# Patient Record
Sex: Female | Born: 1945 | Race: White | Hispanic: No | Marital: Married | Smoking: Former smoker
Health system: Southern US, Community
[De-identification: ages and names within clinical notes are randomized; demographics above are authoritative.]

## PROBLEM LIST (undated history)

## (undated) DIAGNOSIS — D649 Anemia, unspecified: Secondary | ICD-10-CM

## (undated) DIAGNOSIS — E039 Hypothyroidism, unspecified: Secondary | ICD-10-CM

## (undated) DIAGNOSIS — F329 Major depressive disorder, single episode, unspecified: Secondary | ICD-10-CM

## (undated) DIAGNOSIS — G4700 Insomnia, unspecified: Secondary | ICD-10-CM

## (undated) DIAGNOSIS — C801 Malignant (primary) neoplasm, unspecified: Secondary | ICD-10-CM

## (undated) DIAGNOSIS — M869 Osteomyelitis, unspecified: Secondary | ICD-10-CM

## (undated) DIAGNOSIS — I890 Lymphedema, not elsewhere classified: Secondary | ICD-10-CM

## (undated) DIAGNOSIS — K219 Gastro-esophageal reflux disease without esophagitis: Secondary | ICD-10-CM

## (undated) DIAGNOSIS — I10 Essential (primary) hypertension: Secondary | ICD-10-CM

## (undated) DIAGNOSIS — I2510 Atherosclerotic heart disease of native coronary artery without angina pectoris: Secondary | ICD-10-CM

## (undated) DIAGNOSIS — G609 Hereditary and idiopathic neuropathy, unspecified: Secondary | ICD-10-CM

## (undated) DIAGNOSIS — J309 Allergic rhinitis, unspecified: Secondary | ICD-10-CM

## (undated) DIAGNOSIS — Z972 Presence of dental prosthetic device (complete) (partial): Secondary | ICD-10-CM

## (undated) DIAGNOSIS — F419 Anxiety disorder, unspecified: Secondary | ICD-10-CM

## (undated) DIAGNOSIS — F32A Depression, unspecified: Secondary | ICD-10-CM

## (undated) DIAGNOSIS — M1990 Unspecified osteoarthritis, unspecified site: Secondary | ICD-10-CM

## (undated) HISTORY — PX: THYROIDECTOMY: SHX17

## (undated) HISTORY — PX: CHOLECYSTECTOMY: SHX55

## (undated) HISTORY — PX: ABDOMINAL HYSTERECTOMY: SHX81

## (undated) HISTORY — PX: TONSILLECTOMY: SUR1361

## (undated) HISTORY — PX: KNEE ARTHROPLASTY: SHX992

---

## 2006-06-10 ENCOUNTER — Ambulatory Visit: Payer: Self-pay | Admitting: Family Medicine

## 2006-07-23 ENCOUNTER — Ambulatory Visit: Payer: Self-pay | Admitting: Family Medicine

## 2007-08-12 ENCOUNTER — Ambulatory Visit: Payer: Self-pay | Admitting: Family Medicine

## 2008-03-18 ENCOUNTER — Ambulatory Visit: Payer: Self-pay | Admitting: Family Medicine

## 2008-08-16 ENCOUNTER — Ambulatory Visit: Payer: Self-pay | Admitting: Family Medicine

## 2009-08-21 ENCOUNTER — Ambulatory Visit: Payer: Self-pay | Admitting: Family Medicine

## 2010-06-30 ENCOUNTER — Ambulatory Visit: Payer: Self-pay | Admitting: Internal Medicine

## 2010-08-23 ENCOUNTER — Ambulatory Visit: Payer: Self-pay | Admitting: Family Medicine

## 2011-03-14 ENCOUNTER — Emergency Department: Payer: Self-pay | Admitting: Emergency Medicine

## 2011-04-01 ENCOUNTER — Ambulatory Visit: Payer: Self-pay | Admitting: Internal Medicine

## 2011-12-04 ENCOUNTER — Ambulatory Visit: Payer: Self-pay | Admitting: Family Medicine

## 2011-12-05 ENCOUNTER — Ambulatory Visit: Payer: Self-pay | Admitting: Family Medicine

## 2012-11-06 ENCOUNTER — Emergency Department: Payer: Self-pay | Admitting: Emergency Medicine

## 2012-11-06 LAB — COMPREHENSIVE METABOLIC PANEL
Alkaline Phosphatase: 90 U/L (ref 50–136)
Anion Gap: 6 — ABNORMAL LOW (ref 7–16)
Bilirubin,Total: 0.8 mg/dL (ref 0.2–1.0)
Calcium, Total: 9.1 mg/dL (ref 8.5–10.1)
Chloride: 105 mmol/L (ref 98–107)
Co2: 28 mmol/L (ref 21–32)
EGFR (African American): >60
EGFR (Non-African Amer.): >60
Osmolality: 278 (ref 275–301)
Potassium: 3.8 mmol/L (ref 3.5–5.1)
SGOT(AST): 39 U/L — ABNORMAL HIGH (ref 15–37)
Sodium: 139 mmol/L (ref 136–145)

## 2012-11-06 LAB — CBC WITH DIFFERENTIAL/PLATELET
Basophil #: 0.1 10*3/uL (ref 0.0–0.1)
Basophil %: 0.6 %
HCT: 46.5 % (ref 35.0–47.0)
HGB: 16.5 g/dL — ABNORMAL HIGH (ref 12.0–16.0)
Lymphocyte #: 1.8 10*3/uL (ref 1.0–3.6)
MCV: 104 fL — ABNORMAL HIGH (ref 80–100)
Monocyte %: 4.2 %
Neutrophil #: 5.8 10*3/uL (ref 1.4–6.5)
Neutrophil %: 71.4 %
RBC: 4.47 10*6/uL (ref 3.80–5.20)
WBC: 8.2 10*3/uL (ref 3.6–11.0)

## 2012-11-06 LAB — URINALYSIS, COMPLETE
Bacteria: NONE SEEN
Blood: NEGATIVE
Glucose,UR: NEGATIVE mg/dL (ref 0–75)
Nitrite: NEGATIVE
Ph: 6 (ref 4.5–8.0)
Protein: NEGATIVE
RBC,UR: NONE SEEN /HPF (ref 0–5)
Specific Gravity: 1.005 (ref 1.003–1.030)
Squamous Epithelial: NONE SEEN

## 2012-11-06 LAB — TROPONIN I: Troponin-I: <0.02 ng/mL

## 2013-04-07 ENCOUNTER — Ambulatory Visit: Payer: Self-pay | Admitting: Family Medicine

## 2013-12-09 HISTORY — PX: CARDIAC CATHETERIZATION: SHX172

## 2014-04-26 ENCOUNTER — Ambulatory Visit: Payer: Self-pay | Admitting: Family Medicine

## 2014-05-05 ENCOUNTER — Ambulatory Visit: Payer: Self-pay | Admitting: Family Medicine

## 2014-05-09 ENCOUNTER — Ambulatory Visit: Payer: Self-pay | Admitting: Family Medicine

## 2014-05-09 HISTORY — PX: BREAST BIOPSY: SHX20

## 2014-05-12 LAB — PATHOLOGY REPORT

## 2014-08-30 ENCOUNTER — Ambulatory Visit: Payer: Self-pay | Admitting: Emergency Medicine

## 2014-11-09 ENCOUNTER — Ambulatory Visit: Payer: Self-pay | Admitting: Surgery

## 2016-01-31 ENCOUNTER — Other Ambulatory Visit: Payer: Self-pay | Admitting: Hematology and Oncology

## 2016-01-31 DIAGNOSIS — Z1231 Encounter for screening mammogram for malignant neoplasm of breast: Secondary | ICD-10-CM

## 2016-02-29 ENCOUNTER — Ambulatory Visit: Payer: Self-pay

## 2016-03-09 ENCOUNTER — Emergency Department
Admission: EM | Admit: 2016-03-09 | Discharge: 2016-03-09 | Disposition: A | Discharge status: Home / Self Care | Payer: Medicare Other | Attending: Emergency Medicine | Admitting: Emergency Medicine

## 2016-03-09 ENCOUNTER — Emergency Department: Payer: Medicare Other

## 2016-03-09 DIAGNOSIS — L03031 Cellulitis of right toe: Secondary | ICD-10-CM | POA: Diagnosis present

## 2016-03-09 DIAGNOSIS — I10 Essential (primary) hypertension: Secondary | ICD-10-CM | POA: Diagnosis not present

## 2016-03-09 DIAGNOSIS — Z880 Allergy status to penicillin: Secondary | ICD-10-CM | POA: Diagnosis not present

## 2016-03-09 HISTORY — DX: Atherosclerotic heart disease of native coronary artery without angina pectoris: I25.10

## 2016-03-09 HISTORY — DX: Essential (primary) hypertension: I10

## 2016-03-09 HISTORY — DX: Lymphedema, not elsewhere classified: I89.0

## 2016-03-09 HISTORY — DX: Malignant (primary) neoplasm, unspecified: C80.1

## 2016-03-09 MED ORDER — CIPROFLOXACIN HCL 500 MG PO TABS: 500.0000 mg | ORAL_TABLET | Freq: Two times a day (BID) | ORAL | Status: DC

## 2016-03-09 MED ORDER — CEPHALEXIN 500 MG PO CAPS: 500.0000 mg | ORAL_CAPSULE | Freq: Two times a day (BID) | ORAL | Status: DC

## 2016-03-09 NOTE — ED Notes (Signed)
Pt c/o left leg swelling and redness in RLE since Wedensday but worsening. Hx lymphedema and cellulitis. Reports have sore at end of foot, currently wrapped in gauze.

## 2016-03-09 NOTE — ED Provider Notes (Signed)
[facility] Emergency Department Provider Note  ____________________________________________    I have reviewed the triage vital signs and the nursing notes.   HISTORY  Chief Complaint Cellulitis    HPI Wanda Cooper is a 70 y.o. female who presents with complaints of redness on her third toe on her right foot. Patient reports she does not have sensation in her feet, she does not have a history of diabetes. She reports she was trying to cut her toenails last week and actually cut her toe. She has developed some surrounding redness primarily on the dorsal aspect of the third toe. She complains of pain that does travel into her hip from her right foot she does not believe this is related to her toe. She does have a history of cellulitis of the leg but no evidence of skin changes besides on her toe.     Past Medical History  Diagnosis Date  . Lymphedema   . Hypertension   . Coronary artery disease   . Cancer ([place])     There are no active problems to display for this patient.   Past Surgical History  Procedure Laterality Date  . Abdominal hysterectomy    . Knee arthroplasty    . Cholecystectomy      No current outpatient prescriptions on file.  Allergies Amoxicillin; Augmentin; Macrobid; Penicillins; and Sulfur  History reviewed. No pertinent family history.  Social History Social History  Substance Use Topics  . Smoking status: Never Smoker   . Smokeless tobacco: None  . Alcohol Use: No    Review of Systems  Constitutional: Negative for fever.   Musculoskeletal: No calf swelling Skin: As above  Psychiatric: no anxiety    ____________________________________________   PHYSICAL EXAM:  VITAL SIGNS: ED Triage Vitals  Enc Vitals Group     BP 03/09/16 1424 123/65 mmHg     Pulse Rate 03/09/16 1424 76     Resp 03/09/16 1424 16     Temp 03/09/16 1424 97.8 F (36.6 C)     Temp Source 03/09/16 1424 Oral     SpO2 03/09/16 1424  99 %     Weight 03/09/16 1424 170 lb (77.111 kg)     Height 03/09/16 1424 5\' 4"  (1.626 m)     Head Cir --      Peak Flow --      Pain Score 03/09/16 1426 4     Pain Loc --      Pain Edu? --      Excl. in [place]? --    Constitutional: Alert and oriented. Well appearing and in no distress.  Eyes: Conjunctivae are normal. No erythema or injection ENT   Head: Normocephalic and atraumatic.   Mouth/Throat: Mucous membranes are moist. Cardiovascular: Normal rate, regular rhythm. Normal and symmetric distal pulses are present in the lower extremities Respiratory: Normal respiratory effort without tachypnea nor retractions. Breath sounds are clear and equal bilaterally.  Gastrointestinal: Soft and non-tender in all quadrants. No distention. There is no CVA tenderness. Genitourinary: deferred Musculoskeletal: Nontender with normal range of motion in all extremities. No lower extremity tenderness nor edema. Neurologic:  Normal speech and language. No gross focal neurologic deficits are appreciated. Skin:  Skin is warm, dry and intact. Right third toe: Dried blood on the plantar aspect of the distal toe apparently where the patient tried to cut her toenail. There is some mild erythema extending on the dorsal aspect of the toe consistent with early cellulitis/infection. No evidence of  deep ulceration or osteomyelitis. Toe appears well perfused, the rest of the foot is unremarkable. Psychiatric: Mood and affect are normal. Patient exhibits appropriate insight and judgment.  ____________________________________________    LABS (pertinent positives/negatives)  Labs Reviewed - No data to display  ____________________________________________   EKG  None  ____________________________________________    RADIOLOGY  X-ray concerning for osteomyelitis of the tuft of the toe  ____________________________________________   PROCEDURES  Procedure(s) performed: none  Critical Care  performed: none  ____________________________________________   INITIAL IMPRESSION / ASSESSMENT AND PLAN / ED COURSE  Pertinent labs & imaging results that were available during my care of the patient were reviewed by me and considered in my medical decision making (see chart for details).  Suspect early cellulitis. Patient does see Dr. Elvina Mattes of podiatry.   ----------------------------------------- 5:25 PM on 03/09/2016 -----------------------------------------  Discussed x-ray with Dr. Vickki Muff of podiatry. He recommends antibiotics and outpatient follow-up for further management.   I will start antibiotics and have her follow-up with podiatry this week. Patient is well-appearing with normal vitals. She agrees with plan. Return precautions discussed  ____________________________________________   FINAL CLINICAL IMPRESSION(S) / ED DIAGNOSES  Final diagnoses:  Cellulitis of toe of right foot          Lavonia Drafts, MD 03/09/16 1726

## 2016-03-09 NOTE — ED Notes (Signed)
Patient states that about a week ago she cut the third toe on her right foot. Patient has had a sore in that area since then that is not healing. Patient c/o pain in right foot that extends to the hip. Patient has bilateral LE edema but states that this is base line for her.

## 2016-03-09 NOTE — Discharge Instructions (Signed)

## 2016-03-13 ENCOUNTER — Ambulatory Visit
Admission: RE | Admit: 2016-03-13 | Discharge: 2016-03-13 | Disposition: A | Discharge status: Home / Self Care | Payer: Medicare Other | Source: Ambulatory Visit | Attending: Hematology and Oncology | Admitting: Hematology and Oncology

## 2016-03-13 DIAGNOSIS — Z96651 Presence of right artificial knee joint: Secondary | ICD-10-CM | POA: Diagnosis not present

## 2016-03-13 DIAGNOSIS — Z90710 Acquired absence of both cervix and uterus: Secondary | ICD-10-CM | POA: Diagnosis not present

## 2016-03-13 DIAGNOSIS — E039 Hypothyroidism, unspecified: Secondary | ICD-10-CM | POA: Diagnosis not present

## 2016-03-13 DIAGNOSIS — G4700 Insomnia, unspecified: Secondary | ICD-10-CM | POA: Diagnosis not present

## 2016-03-13 DIAGNOSIS — M86671 Other chronic osteomyelitis, right ankle and foot: Secondary | ICD-10-CM | POA: Diagnosis present

## 2016-03-13 DIAGNOSIS — M1990 Unspecified osteoarthritis, unspecified site: Secondary | ICD-10-CM | POA: Diagnosis not present

## 2016-03-13 DIAGNOSIS — Z7982 Long term (current) use of aspirin: Secondary | ICD-10-CM | POA: Diagnosis not present

## 2016-03-13 DIAGNOSIS — K219 Gastro-esophageal reflux disease without esophagitis: Secondary | ICD-10-CM | POA: Diagnosis not present

## 2016-03-13 DIAGNOSIS — Z881 Allergy status to other antibiotic agents status: Secondary | ICD-10-CM | POA: Diagnosis not present

## 2016-03-13 DIAGNOSIS — Z79899 Other long term (current) drug therapy: Secondary | ICD-10-CM | POA: Diagnosis not present

## 2016-03-13 DIAGNOSIS — Z9889 Other specified postprocedural states: Secondary | ICD-10-CM | POA: Diagnosis not present

## 2016-03-13 DIAGNOSIS — L97519 Non-pressure chronic ulcer of other part of right foot with unspecified severity: Secondary | ICD-10-CM | POA: Diagnosis not present

## 2016-03-13 DIAGNOSIS — F329 Major depressive disorder, single episode, unspecified: Secondary | ICD-10-CM | POA: Diagnosis not present

## 2016-03-13 DIAGNOSIS — Z7951 Long term (current) use of inhaled steroids: Secondary | ICD-10-CM | POA: Diagnosis not present

## 2016-03-13 DIAGNOSIS — Z1231 Encounter for screening mammogram for malignant neoplasm of breast: Secondary | ICD-10-CM | POA: Diagnosis not present

## 2016-03-13 DIAGNOSIS — Z87891 Personal history of nicotine dependence: Secondary | ICD-10-CM | POA: Diagnosis not present

## 2016-03-13 DIAGNOSIS — Z9049 Acquired absence of other specified parts of digestive tract: Secondary | ICD-10-CM | POA: Diagnosis not present

## 2016-03-13 DIAGNOSIS — G609 Hereditary and idiopathic neuropathy, unspecified: Secondary | ICD-10-CM | POA: Diagnosis not present

## 2016-03-13 DIAGNOSIS — Z8542 Personal history of malignant neoplasm of other parts of uterus: Secondary | ICD-10-CM | POA: Diagnosis not present

## 2016-03-13 DIAGNOSIS — Z883 Allergy status to other anti-infective agents status: Secondary | ICD-10-CM | POA: Diagnosis not present

## 2016-03-13 DIAGNOSIS — F419 Anxiety disorder, unspecified: Secondary | ICD-10-CM | POA: Diagnosis not present

## 2016-03-13 DIAGNOSIS — I10 Essential (primary) hypertension: Secondary | ICD-10-CM | POA: Diagnosis not present

## 2016-03-13 DIAGNOSIS — Z882 Allergy status to sulfonamides status: Secondary | ICD-10-CM | POA: Diagnosis not present

## 2016-03-13 DIAGNOSIS — E7800 Pure hypercholesterolemia, unspecified: Secondary | ICD-10-CM | POA: Diagnosis not present

## 2016-03-13 DIAGNOSIS — Z7902 Long term (current) use of antithrombotics/antiplatelets: Secondary | ICD-10-CM | POA: Diagnosis not present

## 2016-03-13 DIAGNOSIS — Z880 Allergy status to penicillin: Secondary | ICD-10-CM | POA: Diagnosis not present

## 2016-03-13 NOTE — Discharge Instructions (Signed)
[facility] °[facility] ° °POST OPERATIVE INSTRUCTIONS FOR DR. TROXLER AND DR. FOWLER °[facility] ° ° °1. Take your medication as prescribed.  Pain medication should be taken only as needed. ° °2. Keep the dressing clean, dry and intact. ° °3. Keep your foot elevated above the heart level for the first 48 hours. ° °4. Walking to the bathroom and brief periods of walking are acceptable, unless we have instructed you to be non-weight bearing. ° °5. Always wear your post-op shoe when walking.  Always use your crutches if you are to be non-weight bearing. ° °6. Do not take a shower. Baths are permissible as long as the foot is kept out of the water.  ° °7. Every hour you are awake:  °- Bend your knee 15 times. °- Flex foot 15 times °- Massage calf 15 times ° °8. Call [facility] ([phone]) if any of the following problems occur: °- You develop a temperature or fever. °- The bandage becomes saturated with blood. °- Medication does not stop your pain. °- Injury of the foot occurs. °- Any symptoms of infection including redness, odor, or red streaks running from wound. ° °General Anesthesia, Adult, Care After °Refer to this sheet in the next few weeks. These instructions provide you with information on caring for yourself after your procedure. Your health care provider may also give you more specific instructions. Your treatment has been planned according to current medical practices, but problems sometimes occur. Call your health care provider if you have any problems or questions after your procedure. °WHAT TO EXPECT AFTER THE PROCEDURE °After the procedure, it is typical to experience: °· Sleepiness. °· Nausea and vomiting. °HOME CARE INSTRUCTIONS °· For the first 24 hours after general anesthesia: °¨ Have a responsible person with you. °¨ Do not drive a car. If you are alone, do not take public transportation. °¨ Do not drink alcohol. °¨ Do not take  medicine that has not been prescribed by your health care provider. °¨ Do not sign important papers or make important decisions. °¨ You may resume a normal diet and activities as directed by your health care provider. °· Change bandages (dressings) as directed. °· If you have questions or problems that seem related to general anesthesia, call the hospital and ask for the anesthetist or anesthesiologist on call. °SEEK MEDICAL CARE IF: °· You have nausea and vomiting that continue the day after anesthesia. °· You develop a rash. °SEEK IMMEDIATE MEDICAL CARE IF:  °· You have difficulty breathing. °· You have chest pain. °· You have any allergic problems. °  °This information is not intended to replace advice given to you by your health care provider. Make sure you discuss any questions you have with your health care provider. °  °Document Released: 03/03/2001 Document Revised: 12/16/2014 Document Reviewed: 03/25/2012 °Elsevier Interactive Patient Education ©2016 [employer] ° °

## 2016-03-14 ENCOUNTER — Ambulatory Visit: Payer: Medicare Other | Admitting: Anesthesiology

## 2016-03-14 ENCOUNTER — Encounter
Admission: RE | Disposition: A | Discharge status: Home / Self Care | Payer: Self-pay | Source: Ambulatory Visit | Attending: Podiatry

## 2016-03-14 ENCOUNTER — Ambulatory Visit
Admission: RE | Admit: 2016-03-14 | Discharge: 2016-03-14 | Disposition: A | Discharge status: Home / Self Care | Payer: Medicare Other | Source: Ambulatory Visit | Attending: Podiatry | Admitting: Podiatry

## 2016-03-14 DIAGNOSIS — Z883 Allergy status to other anti-infective agents status: Secondary | ICD-10-CM | POA: Insufficient documentation

## 2016-03-14 DIAGNOSIS — Z9889 Other specified postprocedural states: Secondary | ICD-10-CM | POA: Insufficient documentation

## 2016-03-14 DIAGNOSIS — L97519 Non-pressure chronic ulcer of other part of right foot with unspecified severity: Secondary | ICD-10-CM | POA: Insufficient documentation

## 2016-03-14 DIAGNOSIS — Z7982 Long term (current) use of aspirin: Secondary | ICD-10-CM | POA: Insufficient documentation

## 2016-03-14 DIAGNOSIS — Z8542 Personal history of malignant neoplasm of other parts of uterus: Secondary | ICD-10-CM | POA: Insufficient documentation

## 2016-03-14 DIAGNOSIS — Z1231 Encounter for screening mammogram for malignant neoplasm of breast: Secondary | ICD-10-CM | POA: Insufficient documentation

## 2016-03-14 DIAGNOSIS — Z96651 Presence of right artificial knee joint: Secondary | ICD-10-CM | POA: Insufficient documentation

## 2016-03-14 DIAGNOSIS — M86671 Other chronic osteomyelitis, right ankle and foot: Secondary | ICD-10-CM | POA: Diagnosis not present

## 2016-03-14 DIAGNOSIS — I10 Essential (primary) hypertension: Secondary | ICD-10-CM | POA: Insufficient documentation

## 2016-03-14 DIAGNOSIS — M1990 Unspecified osteoarthritis, unspecified site: Secondary | ICD-10-CM | POA: Insufficient documentation

## 2016-03-14 DIAGNOSIS — Z880 Allergy status to penicillin: Secondary | ICD-10-CM | POA: Insufficient documentation

## 2016-03-14 DIAGNOSIS — F329 Major depressive disorder, single episode, unspecified: Secondary | ICD-10-CM | POA: Insufficient documentation

## 2016-03-14 DIAGNOSIS — E039 Hypothyroidism, unspecified: Secondary | ICD-10-CM | POA: Insufficient documentation

## 2016-03-14 DIAGNOSIS — E7800 Pure hypercholesterolemia, unspecified: Secondary | ICD-10-CM | POA: Insufficient documentation

## 2016-03-14 DIAGNOSIS — Z7951 Long term (current) use of inhaled steroids: Secondary | ICD-10-CM | POA: Insufficient documentation

## 2016-03-14 DIAGNOSIS — Z7902 Long term (current) use of antithrombotics/antiplatelets: Secondary | ICD-10-CM | POA: Insufficient documentation

## 2016-03-14 DIAGNOSIS — Z90710 Acquired absence of both cervix and uterus: Secondary | ICD-10-CM | POA: Insufficient documentation

## 2016-03-14 DIAGNOSIS — Z79899 Other long term (current) drug therapy: Secondary | ICD-10-CM | POA: Insufficient documentation

## 2016-03-14 DIAGNOSIS — G4700 Insomnia, unspecified: Secondary | ICD-10-CM | POA: Insufficient documentation

## 2016-03-14 DIAGNOSIS — Z87891 Personal history of nicotine dependence: Secondary | ICD-10-CM | POA: Insufficient documentation

## 2016-03-14 DIAGNOSIS — F419 Anxiety disorder, unspecified: Secondary | ICD-10-CM | POA: Insufficient documentation

## 2016-03-14 DIAGNOSIS — Z9049 Acquired absence of other specified parts of digestive tract: Secondary | ICD-10-CM | POA: Insufficient documentation

## 2016-03-14 DIAGNOSIS — Z882 Allergy status to sulfonamides status: Secondary | ICD-10-CM | POA: Insufficient documentation

## 2016-03-14 DIAGNOSIS — K219 Gastro-esophageal reflux disease without esophagitis: Secondary | ICD-10-CM | POA: Insufficient documentation

## 2016-03-14 DIAGNOSIS — G609 Hereditary and idiopathic neuropathy, unspecified: Secondary | ICD-10-CM | POA: Insufficient documentation

## 2016-03-14 DIAGNOSIS — Z881 Allergy status to other antibiotic agents status: Secondary | ICD-10-CM | POA: Insufficient documentation

## 2016-03-14 HISTORY — DX: Gastro-esophageal reflux disease without esophagitis: K21.9

## 2016-03-14 HISTORY — DX: Allergic rhinitis, unspecified: J30.9

## 2016-03-14 HISTORY — DX: Presence of dental prosthetic device (complete) (partial): Z97.2

## 2016-03-14 HISTORY — DX: Insomnia, unspecified: G47.00

## 2016-03-14 HISTORY — DX: Osteomyelitis, unspecified: M86.9

## 2016-03-14 HISTORY — DX: Hypothyroidism, unspecified: E03.9

## 2016-03-14 HISTORY — DX: Unspecified osteoarthritis, unspecified site: M19.90

## 2016-03-14 HISTORY — DX: Depression, unspecified: F32.A

## 2016-03-14 HISTORY — DX: Hereditary and idiopathic neuropathy, unspecified: G60.9

## 2016-03-14 HISTORY — PX: METATARSAL OSTEOTOMY: SHX1641

## 2016-03-14 HISTORY — DX: Anxiety disorder, unspecified: F41.9

## 2016-03-14 HISTORY — DX: Major depressive disorder, single episode, unspecified: F32.9

## 2016-03-14 SURGERY — OSTEOTOMY, METATARSAL BONE
Anesthesia: Monitor Anesthesia Care | Laterality: Right

## 2016-03-14 MED ORDER — BUPIVACAINE HCL 0.25 % IJ SOLN
INTRAMUSCULAR | Status: DC | PRN
  Administered 2016-03-14: 4 mL

## 2016-03-14 MED ORDER — ONDANSETRON HCL 4 MG/2ML IJ SOLN
INTRAMUSCULAR | Status: DC | PRN
  Administered 2016-03-14: 4 mg via INTRAVENOUS

## 2016-03-14 MED ORDER — LACTATED RINGERS IV SOLN
INTRAVENOUS | Status: DC
  Administered 2016-03-14: 09:00:00 via INTRAVENOUS

## 2016-03-14 MED ORDER — OXYCODONE HCL 5 MG/5ML PO SOLN: 5.0000 mg | Freq: Once | ORAL | Status: DC | PRN

## 2016-03-14 MED ORDER — PROPOFOL 500 MG/50ML IV EMUL
INTRAVENOUS | Status: DC | PRN
Start: 2016-03-14 — End: 2016-03-14
  Administered 2016-03-14: 140 ug/kg/min via INTRAVENOUS

## 2016-03-14 MED ORDER — GLYCOPYRROLATE 0.2 MG/ML IJ SOLN
INTRAMUSCULAR | Status: DC | PRN
  Administered 2016-03-14: 0.2 mg via INTRAVENOUS

## 2016-03-14 MED ORDER — MIDAZOLAM HCL 2 MG/2ML IJ SOLN
INTRAMUSCULAR | Status: DC | PRN
  Administered 2016-03-14: 2 mg via INTRAVENOUS

## 2016-03-14 MED ORDER — OXYCODONE HCL 5 MG PO TABS: 5.0000 mg | ORAL_TABLET | Freq: Once | ORAL | Status: DC | PRN

## 2016-03-14 MED ORDER — PROMETHAZINE HCL 25 MG/ML IJ SOLN: 6.2500 mg | INTRAMUSCULAR | Status: DC | PRN

## 2016-03-14 MED ORDER — DEXTROSE 5 % IV SOLN
600.0000 mg | Freq: Once | INTRAVENOUS | Status: AC
  Administered 2016-03-14: 600 mg via INTRAVENOUS

## 2016-03-14 MED ORDER — DEXAMETHASONE SODIUM PHOSPHATE 4 MG/ML IJ SOLN
INTRAMUSCULAR | Status: DC | PRN
  Administered 2016-03-14: 4 mg via INTRAVENOUS

## 2016-03-14 MED ORDER — FENTANYL CITRATE (PF) 100 MCG/2ML IJ SOLN
INTRAMUSCULAR | Status: DC | PRN
  Administered 2016-03-14: 25 ug via INTRAVENOUS

## 2016-03-14 MED ORDER — LIDOCAINE HCL (CARDIAC) 20 MG/ML IV SOLN
INTRAVENOUS | Status: DC | PRN
  Administered 2016-03-14: 30 mg via INTRAVENOUS

## 2016-03-14 MED ORDER — HYDROMORPHONE HCL 1 MG/ML IJ SOLN: 0.2500 mg | INTRAMUSCULAR | Status: DC | PRN

## 2016-03-14 SURGICAL SUPPLY — 64 items
BANDAGE ELASTIC 4 CLIP NS LF (GAUZE/BANDAGES/DRESSINGS) ×3 IMPLANT
BENZOIN TINCTURE PRP APPL 2/3 (GAUZE/BANDAGES/DRESSINGS) ×3 IMPLANT
BLADE CRESCENTIC (BLADE) IMPLANT
BLADE MED AGGRESSIVE (BLADE) IMPLANT
BLADE MINI RND TIP GREEN BEAV (BLADE) IMPLANT
BLADE OSC/SAGITTAL 5.5X25 (BLADE) IMPLANT
BLADE OSC/SAGITTAL MD 5.5X18 (BLADE) IMPLANT
BLADE OSC/SAGITTAL MD 9X18.5 (BLADE) IMPLANT
BLADE OSCILLATING/SAGITTAL (BLADE)
BLADE SW THK.38XMED LNG THN (BLADE) IMPLANT
BNDG ESMARK 4X12 TAN STRL LF (GAUZE/BANDAGES/DRESSINGS) ×3 IMPLANT
BNDG GAUZE 4.5X4.1 6PLY STRL (MISCELLANEOUS) ×3 IMPLANT
BNDG STRETCH 4X75 STRL LF (GAUZE/BANDAGES/DRESSINGS) ×3 IMPLANT
BUR EGG 4X8 MED (BURR) IMPLANT
BUR STRYKR EGG 5.0 (BURR) IMPLANT
CANISTER SUCT 1200ML W/VALVE (MISCELLANEOUS) ×3 IMPLANT
CAST PADDING 3X4FT ST 30246 (SOFTGOODS)
CLOSURE WOUND 1/4X4 (GAUZE/BANDAGES/DRESSINGS) ×1
COVER LIGHT HANDLE UNIVERSAL (MISCELLANEOUS) ×6 IMPLANT
COVER PIN YLW 0.028-062 (MISCELLANEOUS) IMPLANT
CUFF TOURN SGL QUICK 18 (TOURNIQUET CUFF) IMPLANT
DRAPE FLUOR MINI C-ARM 54X84 (DRAPES) ×3 IMPLANT
DRILL WIRE PASS (DRILL) IMPLANT
DURAPREP 26ML APPLICATOR (WOUND CARE) ×3 IMPLANT
GAUZE PETRO XEROFOAM 1X8 (MISCELLANEOUS) ×3 IMPLANT
GAUZE SPONGE 4X4 12PLY STRL (GAUZE/BANDAGES/DRESSINGS) ×3 IMPLANT
GLOVE BIO SURGEON STRL SZ8 (GLOVE) ×3 IMPLANT
GOWN STRL REUS W/ TWL LRG LVL3 (GOWN DISPOSABLE) ×1 IMPLANT
GOWN STRL REUS W/ TWL XL LVL3 (GOWN DISPOSABLE) ×1 IMPLANT
GOWN STRL REUS W/TWL LRG LVL3 (GOWN DISPOSABLE) ×2
GOWN STRL REUS W/TWL XL LVL3 (GOWN DISPOSABLE) ×2
HEMOSTAT SURGICEL 2X3 (HEMOSTASIS) ×3 IMPLANT
K-WIRE DBL END TROCAR 6X.045 (WIRE)
K-WIRE DBL END TROCAR 6X.062 (WIRE)
KIT ROOM TURNOVER OR (KITS) ×3 IMPLANT
KWIRE DBL END TROCAR 6X.045 (WIRE) IMPLANT
KWIRE DBL END TROCAR 6X.062 (WIRE) IMPLANT
NEEDLE HYPO 18GX1.5 BLUNT FILL (NEEDLE) IMPLANT
NEEDLE HYPO 25GX1X1/2 BEV (NEEDLE) IMPLANT
NS IRRIG 500ML POUR BTL (IV SOLUTION) ×3 IMPLANT
PACK EXTREMITY ARMC (MISCELLANEOUS) ×3 IMPLANT
PAD CAST CTTN 3X4 STRL (SOFTGOODS) IMPLANT
PAD GROUND ADULT SPLIT (MISCELLANEOUS) ×3 IMPLANT
RASP SM TEAR CROSS CUT (RASP) ×3 IMPLANT
SPLINT CAST 1 STEP 4X30 (MISCELLANEOUS) ×3 IMPLANT
SPLINT FAST PLASTER 5X30 (CAST SUPPLIES)
SPLINT PLASTER CAST FAST 5X30 (CAST SUPPLIES) IMPLANT
STOCKINETTE STRL 6IN 960660 (GAUZE/BANDAGES/DRESSINGS) ×3 IMPLANT
STRAP BODY AND KNEE 60X3 (MISCELLANEOUS) ×3 IMPLANT
STRIP CLOSURE SKIN 1/4X4 (GAUZE/BANDAGES/DRESSINGS) ×2 IMPLANT
SUT ETHILON 4-0 (SUTURE) ×2
SUT ETHILON 4-0 FS2 18XMFL BLK (SUTURE) ×1
SUT ETHILON 5-0 FS-2 18 BLK (SUTURE) IMPLANT
SUT VIC AB 1 CT1 36 (SUTURE) IMPLANT
SUT VIC AB 2-0 CT1 27 (SUTURE)
SUT VIC AB 2-0 CT1 TAPERPNT 27 (SUTURE) IMPLANT
SUT VIC AB 2-0 SH 27 (SUTURE)
SUT VIC AB 2-0 SH 27XBRD (SUTURE) IMPLANT
SUT VIC AB 3-0 SH 27 (SUTURE)
SUT VIC AB 3-0 SH 27X BRD (SUTURE) IMPLANT
SUT VIC AB 4-0 FS2 27 (SUTURE) IMPLANT
SUT VICRYL AB 3-0 FS1 BRD 27IN (SUTURE) IMPLANT
SUTURE ETHLN 4-0 FS2 18XMF BLK (SUTURE) ×1 IMPLANT
SYRINGE 10CC LL (SYRINGE) IMPLANT

## 2016-03-14 NOTE — Anesthesia Preprocedure Evaluation (Signed)
Anesthesia Evaluation  Patient identified by MRN, date of birth, ID band Patient awake    Reviewed: Allergy & Precautions, H&P , NPO status , Patient's Chart, lab work & pertinent test results, reviewed documented beta blocker date and time   Airway Mallampati: II  TM Distance: >3 FB Neck ROM: full    Dental no notable dental hx.    Pulmonary neg pulmonary ROS, former smoker,    Pulmonary exam normal breath sounds clear to auscultation       Cardiovascular Exercise Tolerance: Good hypertension, + CAD   Rhythm:regular Rate:Normal     Neuro/Psych PSYCHIATRIC DISORDERS (anxiety) negative neurological ROS  negative psych ROS   GI/Hepatic Neg liver ROS, GERD  ,  Endo/Other  Hypothyroidism   Renal/GU negative Renal ROS  negative genitourinary   Musculoskeletal   Abdominal   Peds  Hematology negative hematology ROS (+)   Anesthesia Other Findings   Reproductive/Obstetrics negative OB ROS                             Anesthesia Physical Anesthesia Plan  ASA: III  Anesthesia Plan: MAC   Post-op Pain Management:    Induction:   Airway Management Planned:   Additional Equipment:   Intra-op Plan:   Post-operative Plan:   Informed Consent: I have reviewed the patients History and Physical, chart, labs and discussed the procedure including the risks, benefits and alternatives for the proposed anesthesia with the patient or authorized representative who has indicated his/her understanding and acceptance.   Dental Advisory Given  Plan Discussed with: CRNA  Anesthesia Plan Comments:         Anesthesia Quick Evaluation

## 2016-03-14 NOTE — H&P (Signed)
H and P has been reviewed and no changes are noted.  

## 2016-03-14 NOTE — Anesthesia Procedure Notes (Signed)
Procedure Name: MAC Performed by: Georga Bora Pre-anesthesia Checklist: Patient identified, Emergency Drugs available, Suction available, Patient being monitored and Timeout performed Patient Re-evaluated:Patient Re-evaluated prior to inductionOxygen Delivery Method: Simple face mask Placement Confirmation: positive ETCO2 and breath sounds checked- equal and bilateral

## 2016-03-14 NOTE — Op Note (Signed)
Operative note   Surgeon: Dr. Albertine Patricia, DPM.    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 Amputation distal toe at the DIPJ level third toe right foot       EBL: Less than 10 cc    Anesthesia:IV sedation delivered by the anesthesia team and 0.25% Marcaine 3 cc ofthe toe delivered by me preoperatively under sterile prep.    Hemostasis: Ankle tourniquet 250 mils mercury pressure for 10 minutes    Specimen: Distal tip of the third toe including demineralized bone from the distal phalanx and the nail complex. Also some of the bone was sent for culture.    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 After anesthesia was obtained theright lower extremity was prepped and draped in usual sterile fashion.  Operative Report: Patient was in supine position and attention was directed to the distal portion of the third toe of the right foot. 2 similar incisions were made around the region to allow for removal of the nail complex as well as the distal phalanx at the DIPJ level. This incision was carried down to bone the joint was identified and sharply released and the distal phalanx along with nail complex was removed in toto. At this point a portion of the distal phalanx was removed and sent for culture and sensitivity. The rest of the toe complex was sent to pathology for evaluation.  This time there was copiously irrigated the tourniquet was released and bleeders clamped and bovied as required. The area was copiously irrigated at this time. A small segment of Surgicel was placed into the wound since she is still taking aspirin and is on clopidogrel as well. 4-0 nylon was then used to suture the  plantar flap dorsally with simple interrupted sutures. One dogear was moved from the plantar flap and closed with 4-0 nylon in a horizontal mattress stitch. There is checked good alignment and realignment of the skin margins were noted. There was then cleaned and washed and a sterile dressing was applied consisting of Xeroform gauze 4 x 4's Kling and Kerlix. Once tourniquet been released earlier proximally vascularity was seen to return all digits of the right foot.     Patient tolerated the procedure and anesthesia well.  Was transported from the OR to the PACU with all vital signs stable and vascular status intact. To be discharged per routine protocol.  Will follow up in approximately 1 week in the outpatient clinic.

## 2016-03-14 NOTE — Transfer of Care (Signed)
Immediate Anesthesia Transfer of Care Note  Patient: Wanda Cooper  Procedure(s) Performed: Procedure(s) with comments: METATARSAL OSTEOTOMY RT 3RD TOE INTERPHALANGEAL JOINT  (Right) - IVA WITH LOCAL  Patient Location: PACU  Anesthesia Type: MAC  Level of Consciousness: awake, alert  and patient cooperative  Airway and Oxygen Therapy: Patient Spontanous Breathing and Patient connected to supplemental oxygen  Post-op Assessment: Post-op Vital signs reviewed, Patient's Cardiovascular Status Stable, Respiratory Function Stable, Patent Airway and No signs of Nausea or vomiting  Post-op Vital Signs: Reviewed and stable  Complications: No apparent anesthesia complications

## 2016-03-14 NOTE — Anesthesia Postprocedure Evaluation (Signed)
Anesthesia Post Note  Patient: Teleah Tager Mccallum  Procedure(s) Performed: Procedure(s) (LRB): METATARSAL OSTEOTOMY RT 3RD TOE INTERPHALANGEAL JOINT  (Right)  Patient location during evaluation: PACU Anesthesia Type: MAC Level of consciousness: awake and alert Pain management: pain level controlled Vital Signs Assessment: post-procedure vital signs reviewed and stable Respiratory status: spontaneous breathing, nonlabored ventilation, respiratory function stable and patient connected to nasal cannula oxygen Cardiovascular status: blood pressure returned to baseline and stable Postop Assessment: no signs of nausea or vomiting Anesthetic complications: no    Alisa Graff

## 2016-03-15 ENCOUNTER — Encounter: Payer: Self-pay | Admitting: Podiatry

## 2016-03-19 LAB — SURGICAL PATHOLOGY

## 2016-12-18 ENCOUNTER — Encounter: Payer: Self-pay | Admitting: *Deleted

## 2016-12-24 NOTE — Discharge Instructions (Signed)
[facility] °[facility] ° °POST OPERATIVE INSTRUCTIONS FOR DR. TROXLER AND DR. FOWLER °[facility] ° ° °1. Take your medication as prescribed.  Pain medication should be taken only as needed. ° °2. Keep the dressing clean, dry and intact. ° °3. Keep your foot elevated above the heart level for the first 48 hours. ° °4. Walking to the bathroom and brief periods of walking are acceptable, unless we have instructed you to be non-weight bearing. ° °5. Always wear your post-op shoe when walking.  Always use your crutches if you are to be non-weight bearing. ° °6. Do not take a shower. Baths are permissible as long as the foot is kept out of the water.  ° °7. Every hour you are awake:  °- Bend your knee 15 times. °- Flex foot 15 times °- Massage calf 15 times ° °8. Call [facility] ([phone]) if any of the following problems occur: °- You develop a temperature or fever. °- The bandage becomes saturated with blood. °- Medication does not stop your pain. °- Injury of the foot occurs. °- Any symptoms of infection including redness, odor, or red streaks running from wound. ° ° °General Anesthesia, Adult, Care After °These instructions provide you with information about caring for yourself after your procedure. Your health care provider may also give you more specific instructions. Your treatment has been planned according to current medical practices, but problems sometimes occur. Call your health care provider if you have any problems or questions after your procedure. °What can I expect after the procedure? °After the procedure, it is common to have: °· Vomiting. °· A sore throat. °· Mental slowness. °It is common to feel: °· Nauseous. °· Cold or shivery. °· Sleepy. °· Tired. °· Sore or achy, even in parts of your body where you did not have surgery. °Follow these instructions at home: °For at least 24 hours after the procedure: °· Do not: °¨ Participate in  activities where you could fall or become injured. °¨ Drive. °¨ Use heavy machinery. °¨ Drink alcohol. °¨ Take sleeping pills or medicines that cause drowsiness. °¨ Make important decisions or sign legal documents. °¨ Take care of children on your own. °· Rest. °Eating and drinking °· If you vomit, drink water, juice, or soup when you can drink without vomiting. °· Drink enough fluid to keep your urine clear or pale yellow. °· Make sure you have little or no nausea before eating solid foods. °· Follow the diet recommended by your health care provider. °General instructions °· Have a responsible adult stay with you until you are awake and alert. °· Return to your normal activities as told by your health care provider. Ask your health care provider what activities are safe for you. °· Take over-the-counter and prescription medicines only as told by your health care provider. °· If you smoke, do not smoke without supervision. °· Keep all follow-up visits as told by your health care provider. This is important. °Contact a health care provider if: °· You continue to have nausea or vomiting at home, and medicines are not helpful. °· You cannot drink fluids or start eating again. °· You cannot urinate after 8-12 hours. °· You develop a skin rash. °· You have fever. °· You have increasing redness at the site of your procedure. °Get help right away if: °· You have difficulty breathing. °· You have chest pain. °· You have unexpected bleeding. °· You feel that you are having   a life-threatening or urgent problem. °This information is not intended to replace advice given to you by your health care provider. Make sure you discuss any questions you have with your health care provider. °Document Released: 03/03/2001 Document Revised: 04/29/2016 Document Reviewed: 11/09/2015 °Elsevier Interactive Patient Education © 2017 [employer] ° °

## 2017-01-16 ENCOUNTER — Encounter: Payer: Self-pay | Admitting: Anesthesiology

## 2017-01-16 ENCOUNTER — Ambulatory Visit
Admission: RE | Admit: 2017-01-16 | Discharge: 2017-01-16 | Disposition: A | Discharge status: Home / Self Care | Payer: Medicare Other | Source: Ambulatory Visit | Attending: Podiatry | Admitting: Podiatry

## 2017-01-16 ENCOUNTER — Encounter
Admission: RE | Disposition: A | Discharge status: Home / Self Care | Payer: Self-pay | Source: Ambulatory Visit | Attending: Podiatry

## 2017-01-16 DIAGNOSIS — M2041 Other hammer toe(s) (acquired), right foot: Secondary | ICD-10-CM | POA: Insufficient documentation

## 2017-01-16 DIAGNOSIS — Z87891 Personal history of nicotine dependence: Secondary | ICD-10-CM | POA: Diagnosis not present

## 2017-01-16 DIAGNOSIS — M2021 Hallux rigidus, right foot: Secondary | ICD-10-CM | POA: Insufficient documentation

## 2017-01-16 DIAGNOSIS — Z7902 Long term (current) use of antithrombotics/antiplatelets: Secondary | ICD-10-CM | POA: Diagnosis not present

## 2017-01-16 DIAGNOSIS — M2011 Hallux valgus (acquired), right foot: Secondary | ICD-10-CM | POA: Diagnosis not present

## 2017-01-16 HISTORY — PX: ARTHRODESIS METATARSALPHALANGEAL JOINT (MTPJ): SHX6566

## 2017-01-16 HISTORY — PX: HAMMER TOE SURGERY: SHX385

## 2017-01-16 SURGERY — FUSION, JOINT, GREAT TOE
Anesthesia: Monitor Anesthesia Care | Site: Toe | Laterality: Right | Wound class: Clean

## 2017-01-16 MED ORDER — KETOROLAC TROMETHAMINE 30 MG/ML IJ SOLN
INTRAMUSCULAR | Status: DC | PRN
  Administered 2017-01-16: 30 mg via INTRAVENOUS

## 2017-01-16 MED ORDER — ENOXAPARIN SODIUM 40 MG/0.4ML ~~LOC~~ SOLN
40.0000 mg | Freq: Once | SUBCUTANEOUS | Status: AC
Start: 2017-01-16 — End: 2017-01-16
  Administered 2017-01-16: 40 mg via SUBCUTANEOUS

## 2017-01-16 MED ORDER — BUPIVACAINE HCL (PF) 0.25 % IJ SOLN
INTRAMUSCULAR | Status: DC | PRN
  Administered 2017-01-16: 8 mL
  Administered 2017-01-16: 7.5 mL

## 2017-01-16 MED ORDER — FENTANYL CITRATE (PF) 100 MCG/2ML IJ SOLN
INTRAMUSCULAR | Status: DC | PRN
  Administered 2017-01-16 (×2): 25 ug via INTRAVENOUS
  Administered 2017-01-16: 50 ug via INTRAVENOUS

## 2017-01-16 MED ORDER — SODIUM CHLORIDE 0.9 % IV SOLN
600.0000 mg | Freq: Once | INTRAVENOUS | Status: AC
  Administered 2017-01-16: 600 mg via INTRAVENOUS

## 2017-01-16 MED ORDER — MIDAZOLAM HCL 5 MG/5ML IJ SOLN
INTRAMUSCULAR | Status: DC | PRN
  Administered 2017-01-16: 1 mg via INTRAVENOUS

## 2017-01-16 MED ORDER — BUPIVACAINE HCL (PF) 0.5 % IJ SOLN: INTRAMUSCULAR | Status: DC | PRN

## 2017-01-16 MED ORDER — LIDOCAINE HCL (CARDIAC) 20 MG/ML IV SOLN
INTRAVENOUS | Status: DC | PRN
  Administered 2017-01-16: 30 mg via INTRATRACHEAL

## 2017-01-16 MED ORDER — DEXAMETHASONE SODIUM PHOSPHATE 4 MG/ML IJ SOLN
INTRAMUSCULAR | Status: DC | PRN
  Administered 2017-01-16: 4 mg via INTRAVENOUS

## 2017-01-16 MED ORDER — EPHEDRINE SULFATE 50 MG/ML IJ SOLN
INTRAMUSCULAR | Status: DC | PRN
  Administered 2017-01-16: 5 mg via INTRAVENOUS
  Administered 2017-01-16 (×2): 10 mg via INTRAVENOUS
  Administered 2017-01-16: 5 mg via INTRAVENOUS
  Administered 2017-01-16 (×2): 10 mg via INTRAVENOUS

## 2017-01-16 MED ORDER — PROPOFOL 10 MG/ML IV BOLUS
INTRAVENOUS | Status: DC | PRN
  Administered 2017-01-16: 60 mg via INTRAVENOUS

## 2017-01-16 MED ORDER — LACTATED RINGERS IV SOLN
INTRAVENOUS | Status: DC
  Administered 2017-01-16: 07:00:00 via INTRAVENOUS

## 2017-01-16 MED ORDER — LIDOCAINE HCL (PF) 1 % IJ SOLN
INTRAMUSCULAR | Status: DC | PRN
  Administered 2017-01-16: 7.5 mL

## 2017-01-16 MED ORDER — ACETAMINOPHEN 325 MG PO TABS: 325.0000 mg | ORAL_TABLET | ORAL | Status: DC | PRN

## 2017-01-16 MED ORDER — OXYCODONE-ACETAMINOPHEN 7.5-325 MG PO TABS: 1.0000 | ORAL_TABLET | ORAL | 0 refills | Status: AC | PRN

## 2017-01-16 MED ORDER — ONDANSETRON HCL 4 MG/2ML IJ SOLN
INTRAMUSCULAR | Status: DC | PRN
Start: 2017-01-16 — End: 2017-01-16
  Administered 2017-01-16: 4 mg via INTRAVENOUS

## 2017-01-16 MED ORDER — GLYCOPYRROLATE 0.2 MG/ML IJ SOLN
INTRAMUSCULAR | Status: DC | PRN
  Administered 2017-01-16: 0.1 mg via INTRAVENOUS

## 2017-01-16 MED ORDER — ACETAMINOPHEN 160 MG/5ML PO SOLN: 325.0000 mg | ORAL | Status: DC | PRN

## 2017-01-16 SURGICAL SUPPLY — 89 items
BANDAGE ELASTIC 4 LF NS (GAUZE/BANDAGES/DRESSINGS) ×4 IMPLANT
BENZOIN TINCTURE PRP APPL 2/3 (GAUZE/BANDAGES/DRESSINGS) ×4 IMPLANT
BIT DRILL 2 FENESTRATED (MISCELLANEOUS) ×2 IMPLANT
BIT DRILL CANNULTD 2.6 X 130MM (MISCELLANEOUS) ×2 IMPLANT
BIT DRILL SOLID 2.0 X 110MM (DRILL) ×2 IMPLANT
BIT DRILLL 2 FENESTRATED (MISCELLANEOUS) ×2
BLADE CRESCENTIC (BLADE) IMPLANT
BLADE MED AGGRESSIVE (BLADE) IMPLANT
BLADE MINI RND TIP GREEN BEAV (BLADE) ×4 IMPLANT
BLADE OSC/SAGITTAL 5.5X25 (BLADE) IMPLANT
BLADE OSC/SAGITTAL MD 5.5X18 (BLADE) ×4 IMPLANT
BLADE OSC/SAGITTAL MD 9X18.5 (BLADE) ×4 IMPLANT
BLADE OSCILLATING/SAGITTAL (BLADE)
BLADE SW THK.38XMED LNG THN (BLADE) IMPLANT
BNDG ESMARK 4X12 TAN STRL LF (GAUZE/BANDAGES/DRESSINGS) ×4 IMPLANT
BNDG GAUZE 4.5X4.1 6PLY STRL (MISCELLANEOUS) ×4 IMPLANT
BNDG STRETCH 4X75 STRL LF (GAUZE/BANDAGES/DRESSINGS) ×4 IMPLANT
BUR EGG 4X8 MED (BURR) IMPLANT
BUR STRYKR EGG 5.0 (BURR) IMPLANT
BUR SURG 4X8 MED (BURR) ×2 IMPLANT
BURR SURG 4MMX8MM MEDIUM (BURR) ×1
BURR SURG 4X8 MED (BURR) ×3
CANISTER SUCT 1200ML W/VALVE (MISCELLANEOUS) ×4 IMPLANT
CAST PADDING 3X4FT ST 30246 (SOFTGOODS) ×2
CLOSURE WOUND 1/4X4 (GAUZE/BANDAGES/DRESSINGS) ×1
CONE REAMER 21 (MISCELLANEOUS) ×3 IMPLANT
COUNTERSICK 4.0 HEADED (MISCELLANEOUS) ×4
COVER LIGHT HANDLE UNIVERSAL (MISCELLANEOUS) ×8 IMPLANT
COVER PIN YLW 0.028-062 (MISCELLANEOUS) ×8 IMPLANT
CUFF TOURN SGL QUICK 18 (TOURNIQUET CUFF) ×4 IMPLANT
CUP REAMER 21 (MISCELLANEOUS) ×3 IMPLANT
DRAPE FLUOR MINI C-ARM 54X84 (DRAPES) ×4 IMPLANT
DRILL CANNULATED 2.6 X 130MM (MISCELLANEOUS) ×4
DRILL SOLID 2.0 X 110MM (DRILL) ×4
DRILL WIRE PASS (DRILL) IMPLANT
DURAPREP 26ML APPLICATOR (WOUND CARE) ×4 IMPLANT
GAUZE PETRO XEROFOAM 1X8 (MISCELLANEOUS) ×4 IMPLANT
GAUZE SPONGE 4X4 12PLY STRL (GAUZE/BANDAGES/DRESSINGS) ×4 IMPLANT
GLOVE BIO SURGEON STRL SZ8 (GLOVE) ×8 IMPLANT
GLOVE INDICATOR 8.0 STRL GRN (GLOVE) ×4 IMPLANT
GOWN STRL REUS W/ TWL LRG LVL3 (GOWN DISPOSABLE) ×2 IMPLANT
GOWN STRL REUS W/ TWL XL LVL3 (GOWN DISPOSABLE) ×2 IMPLANT
GOWN STRL REUS W/TWL LRG LVL3 (GOWN DISPOSABLE) ×2
GOWN STRL REUS W/TWL XL LVL3 (GOWN DISPOSABLE) ×2
K-WIRE DBL END TROCAR 6X.045 (WIRE)
K-WIRE DBL END TROCAR 6X.062 (WIRE)
K-WIRE SMOOTH 1.6X150MM (WIRE) ×8
K-WIRE SNGL END 1.2X150 (MISCELLANEOUS) ×8
KIT ROOM TURNOVER OR (KITS) ×4 IMPLANT
KWIRE DBL END TROCAR 6X.045 (WIRE) IMPLANT
KWIRE DBL END TROCAR 6X.062 (WIRE) IMPLANT
KWIRE SMOOTH 1.6X150MM (WIRE) ×4 IMPLANT
KWIRE SNGL END 1.2X150 (MISCELLANEOUS) ×4 IMPLANT
MICROAIRE K-WIRE (Wire) ×4 IMPLANT
NEEDLE HYPO 18GX1.5 BLUNT FILL (NEEDLE) ×4 IMPLANT
NEEDLE HYPO 25GX1X1/2 BEV (NEEDLE) ×4 IMPLANT
NS IRRIG 500ML POUR BTL (IV SOLUTION) ×4 IMPLANT
PACK EXTREMITY ARMC (MISCELLANEOUS) ×4 IMPLANT
PAD CAST CTTN 3X4 STRL (SOFTGOODS) ×2 IMPLANT
PAD GROUND ADULT SPLIT (MISCELLANEOUS) ×4 IMPLANT
PLATE MTP 0DEG RIGHT (Plate) ×4 IMPLANT
RASP SM TEAR CROSS CUT (RASP) ×4 IMPLANT
SCREW 4.0X28 STRL (Screw) ×3 IMPLANT
SCREW COUNTERSINK 4.0 HEADED (MISCELLANEOUS) ×2 IMPLANT
SCREW LOCK PLATE R3 2.7X12 (Screw) ×3 IMPLANT
SCREW LOCK PLATE R3 2.7X16 ×4 IMPLANT
SCREW LOCK PLATE R3 2.7X18 (Screw) ×8 IMPLANT
SCREW NON LOCKING 2.7X20 (Screw) ×3 IMPLANT
SPLINT CAST 1 STEP 4X30 (MISCELLANEOUS) ×4 IMPLANT
SPLINT FAST PLASTER 5X30 (CAST SUPPLIES) ×2
SPLINT PLASTER CAST FAST 5X30 (CAST SUPPLIES) ×2 IMPLANT
STOCKINETTE STRL 6IN 960660 (GAUZE/BANDAGES/DRESSINGS) ×4 IMPLANT
STRAP BODY AND KNEE 60X3 (MISCELLANEOUS) ×4 IMPLANT
STRIP CLOSURE SKIN 1/4X4 (GAUZE/BANDAGES/DRESSINGS) ×3 IMPLANT
SUT ETHILON 4-0 (SUTURE)
SUT ETHILON 4-0 FS2 18XMFL BLK (SUTURE)
SUT ETHILON 5-0 FS-2 18 BLK (SUTURE) ×8 IMPLANT
SUT VIC AB 1 CT1 36 (SUTURE) IMPLANT
SUT VIC AB 2-0 CT1 27 (SUTURE)
SUT VIC AB 2-0 CT1 TAPERPNT 27 (SUTURE) IMPLANT
SUT VIC AB 2-0 SH 27 (SUTURE)
SUT VIC AB 2-0 SH 27XBRD (SUTURE) IMPLANT
SUT VIC AB 3-0 SH 27 (SUTURE)
SUT VIC AB 3-0 SH 27X BRD (SUTURE) IMPLANT
SUT VIC AB 4-0 FS2 27 (SUTURE) ×12 IMPLANT
SUT VICRYL AB 3-0 FS1 BRD 27IN (SUTURE) IMPLANT
SUTURE ETHLN 4-0 FS2 18XMF BLK (SUTURE) IMPLANT
SYRINGE 10CC LL (SYRINGE) ×4 IMPLANT
WIRE OLIVE SMOOTH 1.4MMX60MM (WIRE) ×6 IMPLANT

## 2017-01-16 NOTE — Op Note (Signed)
Operative note   Surgeon: Dr. Albertine Patricia, DPM.    Assistant: None    Preop diagnosis: 1. Hallux rigidus/hallux valgus right foot                                   2. 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 Hammertoe deformity fourth toe right foot 4. Hammertoe deformity fifth toe right foot   Postop diagnosis: Same    Procedure:   1. Arthrodesis first metatarsal phalangeal joint right foot   2. Hammertoe repair with overlapping toe repair of flexor tendon transfer and arthrodesis of the PIP joints second toe right foot   3. Hammertoe repair with arthroplasty and K wire fixation fourth toe right foot    4. Hammertoe repair with arthroplasty fifth toe right foot     EBL: 10 cc    Anesthesia:general delivered by anesthesia team and local block delivered by me. This consisted of 10 cc of a 50-50 mixture of 0 0.25% Marcaine and 1% lidocaine plain injected the base of the operative sites. At the end of the case 8 cc of 0 0.25% Marcaine plain was utilized.    Hemostasis: Ankle tourniquet 250 mils mercury mercury pressure for 107 minutes    Specimen: None    Complications: None    Operative indications: Chronic pain and deformity to the right foot unresponsive to conservative care    Procedure:  Patient was brought into the OR and placed on the operating table in thesupine position. After anesthesia was obtained theright lower extremity was prepped and draped in usual sterile fashion.  Operative Report: This time attention was directed to the first metatarsal phalangeal joint where a 5 cm linear incision was made and deepened sharp blunt dissection bleeders clamped and bovied as required. Capsular tissue was then removed away from the first metatarsophalangeal joint as was periosteum from the proximal phalanx and metatarsal shaft. This was reflected mediolaterally. At this point some arthritic spurring was noted on the dorsal aspect of the joint this was  removed. The articular cartilage of first metatarsal head was identified and a cup and cone reamer was used to remove cartilage from this area as well as the base of the proximal phalanx. A bur was used to remove further chondral areas from the proximal phalanx. Once this was accomplished both areas were fenestrated with a on 0.5 drill bit. And the toe was approximated to the good position the toe was taken out of its valgus and abduction deformity and temporarily fixated across the first metatarsal phalangeal joint where holding the corrected position. There is checked FluoroScan good position and correction were noted. This point a [employer] first MTPJ fusion set was utilized to first place a 4.0 cannulated compression screw across the first MTPJ this was checked FluoroScan good position correction were noted. This point to distal and 3 proximal screws were placed across the first MTPJ fusion plate. The most proximal screw was a compression screw the others were nonlocking screws. There is checked FluoroScan good position correction fixation were noted. There is an copiously irrigated and capsule tissue was closed with 4 Vicryl in continuous stitch. Deep superficial fascial layers were closed with 4 Vicryl in continuous stitch. Skin was closed with 4 Vicryl in a subcuticular stitch. Nylon was used to close a medial stab incision for the placement screw.  Second procedure: At this time to his directed the second  metatarsophalangeal joint second toe. A curvilinear incision was made over the second MTP joint extended to the dorsum of the second toe. The incision was deepened sharp blunt dissection bleeders clamped and bovied as required. The extensor tendon was identified and a tendon lengthening using a Z-plasty technique was accomplished. This was stabilized with 2 simple interrupted Vicryl 40 sutures. This was retracted laterally and a dorsal capsulotomy was performed and a McGlamry elevator was used to free up  scar tissue around the joint.  At this time attention was directed to the PIP joint of the toe where the extensor tendon was identified and incised transversely reflected proximally. The flexor capsule at the plantar PIP joint was identified and incised longitudinally. The long flexor tendon was isolated clamped and released at the base of the proximal phalanx this was then split rotated and re-anchored to the midshaft area of the proximal phalanx to allow for more plantar flexion of the digit. This was sutured with 3-0 Vicryl simple interrupted sutures. At this point a 0.5 K wire was drilled through the middle distal phalanges and retrograded proximal phalanx while compressing the PIP joint. While holding the toe in a rectus corrected position the pin was then drilled across the metatarsophalangeal joint and up the shaft of the metatarsal. There is checked FluoroScan good position fixation correction were noted. There is an copiously irrigated. The extensor tendon over the PIP joint was closed with 4 Vicryl in 3 simple interrupted sutures. After further irrigation proximally the deep and superficial fascial layers were closed with 4 Vicryl continuous stitch as was skin closed proximally with a 4 Vicryl subcuticular running stitch. Skin over the toe was closed with 5-0 nylon simple ruptured and horizontal mattress combination.  Procedure #3. This time to his directed the fourth toe of the right foot where 2 similar incisions were made over the PIP joint. This ellipse skin was then removed these were done slightly obliquely from proximal lateral distal medial the ellipse skin was then removed the extensor tendon was then 5 incised transversely reflected proximally. At the proximal phalanx was identified and resected with power equipment. A 0.5 K wires and drilled through the middle distal phalanges and retrograded proximal phalanx while the toe in a rectus position. There is checked FluoroScan good position  correction were noted. After copious irrigation the extensor tendons and reapproximated with 4-0 Vicryl simple ruptured sutures. Skin closed with 5-0 nylon horizontal mattress and simple interrupted combination.  Procedure #4: This time attention was directed to the fifth toe where similar procedure was done as that on the fourth without the K wire fixation.  This time surgical areas were a blocked with 0.25% Marcaine plain. Sterile compressive dressings in placed across the wounds consisting of Steri-Strips Xeroform gauze 4 x 4's Kling and Kerlix. Tourniquet was released and prompt complete vascularity was seen to return all digits of the right foot. A posterior splint is placed on the right foot leg in the operating room.      Patient tolerated the procedure and anesthesia well.  Was transported from the OR to the PACU with all vital signs stable and vascular status intact. To be discharged per routine protocol.  Will follow up in approximately 1 week in the outpatient clinic.

## 2017-01-16 NOTE — Anesthesia Postprocedure Evaluation (Signed)
Anesthesia Post Note  Patient: Wanda Cooper  Procedure(s) Performed: Procedure(s) (LRB): ARTHRODESIS METATARSALPHALANGEAL JOINT (MTPJ) (Right) HAMMER TOE CORRECTION 4th and 5th toes and overlapping hammer toe correction 2nd toe (Right)  Patient location during evaluation: PACU Anesthesia Type: General Level of consciousness: awake and alert Pain management: pain level controlled Vital Signs Assessment: post-procedure vital signs reviewed and stable Respiratory status: spontaneous breathing, nonlabored ventilation, respiratory function stable and patient connected to nasal cannula oxygen Cardiovascular status: blood pressure returned to baseline and stable Postop Assessment: no signs of nausea or vomiting Anesthetic complications: no    Trecia Rogers

## 2017-01-16 NOTE — Anesthesia Procedure Notes (Signed)
Procedure Name: LMA Insertion Performed by: Georga Bora Pre-anesthesia Checklist: Patient identified, Emergency Drugs available, Suction available, Timeout performed and Patient being monitored Patient Re-evaluated:Patient Re-evaluated prior to inductionOxygen Delivery Method: Circle system utilized Preoxygenation: Pre-oxygenation with 100% oxygen Intubation Type: IV induction LMA: LMA inserted LMA Size: 4.0 Number of attempts: 1 Placement Confirmation: positive ETCO2 and breath sounds checked- equal and bilateral Tube secured with: Tape

## 2017-01-16 NOTE — H&P (Signed)
H and P has been reviewed and no changes are noted. Pt. Stopped  plavix one week ago.

## 2017-01-16 NOTE — Anesthesia Preprocedure Evaluation (Addendum)
Anesthesia Evaluation  Patient identified by MRN, date of birth, ID band Patient awake    Reviewed: Allergy & Precautions, H&P , NPO status , Patient's Chart, lab work & pertinent test results, reviewed documented beta blocker date and time   Airway Mallampati: I  TM Distance: >3 FB Neck ROM: full    Dental  (+) Partial Upper, Lower Dentures   Pulmonary former smoker,    Pulmonary exam normal breath sounds clear to auscultation       Cardiovascular Exercise Tolerance: Good hypertension, + CAD and + Cardiac Stents  Normal cardiovascular exam Rhythm:regular Rate:Normal  Off Plavix for 7 days    Neuro/Psych PSYCHIATRIC DISORDERS depressionnegative neurological ROS     GI/Hepatic Neg liver ROS, GERD  Medicated and Controlled,  Endo/Other  Hypothyroidism   Renal/GU negative Renal ROS  negative genitourinary   Musculoskeletal  (+) Arthritis ,   Abdominal   Peds  Hematology negative hematology ROS (+)   Anesthesia Other Findings   Reproductive/Obstetrics negative OB ROS                             Anesthesia Physical Anesthesia Plan  ASA: III  Anesthesia Plan: General   Post-op Pain Management:    Induction:   Airway Management Planned:   Additional Equipment:   Intra-op Plan:   Post-operative Plan:   Informed Consent: I have reviewed the patients History and Physical, chart, labs and discussed the procedure including the risks, benefits and alternatives for the proposed anesthesia with the patient or authorized representative who has indicated his/her understanding and acceptance.   Dental Advisory Given  Plan Discussed with: CRNA  Anesthesia Plan Comments:        Anesthesia Quick Evaluation

## 2017-01-16 NOTE — Transfer of Care (Signed)
Immediate Anesthesia Transfer of Care Note  Patient: Kona Yusuf Baquero  Procedure(s) Performed: Procedure(s) with comments: ARTHRODESIS METATARSALPHALANGEAL JOINT (MTPJ) (Right) - LOCAL/W LMA HAMMER TOE CORRECTION 4th and 5th toes and overlapping hammer toe correction 2nd toe (Right)  Patient Location: PACU  Anesthesia Type: MAC  Level of Consciousness: awake, alert  and patient cooperative  Airway and Oxygen Therapy: Patient Spontanous Breathing and Patient connected to supplemental oxygen  Post-op Assessment: Post-op Vital signs reviewed, Patient's Cardiovascular Status Stable, Respiratory Function Stable, Patent Airway and No signs of Nausea or vomiting  Post-op Vital Signs: Reviewed and stable  Complications: No apparent anesthesia complications

## 2017-01-17 ENCOUNTER — Encounter: Payer: Self-pay | Admitting: Podiatry

## 2017-05-08 ENCOUNTER — Other Ambulatory Visit: Payer: Self-pay | Admitting: Family Medicine

## 2017-05-08 DIAGNOSIS — Z1231 Encounter for screening mammogram for malignant neoplasm of breast: Secondary | ICD-10-CM

## 2017-05-13 ENCOUNTER — Ambulatory Visit
Admission: RE | Admit: 2017-05-13 | Discharge: 2017-05-13 | Disposition: A | Discharge status: Home / Self Care | Payer: Medicare Other | Source: Ambulatory Visit | Attending: Family Medicine | Admitting: Family Medicine

## 2017-05-13 DIAGNOSIS — Z1231 Encounter for screening mammogram for malignant neoplasm of breast: Secondary | ICD-10-CM | POA: Insufficient documentation

## 2017-12-09 HISTORY — PX: CERVICAL FUSION: SHX112

## 2018-05-12 ENCOUNTER — Other Ambulatory Visit: Payer: Self-pay | Admitting: Family Medicine

## 2018-05-12 DIAGNOSIS — Z1231 Encounter for screening mammogram for malignant neoplasm of breast: Secondary | ICD-10-CM

## 2018-05-19 ENCOUNTER — Ambulatory Visit
Admission: RE | Admit: 2018-05-19 | Discharge: 2018-05-19 | Disposition: A | Discharge status: Home / Self Care | Payer: Medicare Other | Source: Ambulatory Visit | Attending: Family Medicine | Admitting: Family Medicine

## 2018-05-19 DIAGNOSIS — Z1231 Encounter for screening mammogram for malignant neoplasm of breast: Secondary | ICD-10-CM | POA: Insufficient documentation

## 2018-11-15 ENCOUNTER — Other Ambulatory Visit: Payer: Self-pay

## 2018-11-15 ENCOUNTER — Emergency Department
Admission: EM | Admit: 2018-11-15 | Discharge: 2018-11-15 | Disposition: A | Discharge status: Other Acute Inpatient Hospital | Payer: Medicare Other | Attending: Emergency Medicine | Admitting: Emergency Medicine

## 2018-11-15 ENCOUNTER — Emergency Department: Payer: Medicare Other

## 2018-11-15 DIAGNOSIS — Y999 Unspecified external cause status: Secondary | ICD-10-CM | POA: Diagnosis not present

## 2018-11-15 DIAGNOSIS — I2510 Atherosclerotic heart disease of native coronary artery without angina pectoris: Secondary | ICD-10-CM | POA: Diagnosis not present

## 2018-11-15 DIAGNOSIS — Z7902 Long term (current) use of antithrombotics/antiplatelets: Secondary | ICD-10-CM | POA: Insufficient documentation

## 2018-11-15 DIAGNOSIS — Z85850 Personal history of malignant neoplasm of thyroid: Secondary | ICD-10-CM | POA: Diagnosis not present

## 2018-11-15 DIAGNOSIS — W07XXXA Fall from chair, initial encounter: Secondary | ICD-10-CM | POA: Diagnosis not present

## 2018-11-15 DIAGNOSIS — S12501A Unspecified nondisplaced fracture of sixth cervical vertebra, initial encounter for closed fracture: Secondary | ICD-10-CM | POA: Insufficient documentation

## 2018-11-15 DIAGNOSIS — Z7982 Long term (current) use of aspirin: Secondary | ICD-10-CM | POA: Insufficient documentation

## 2018-11-15 DIAGNOSIS — Z87891 Personal history of nicotine dependence: Secondary | ICD-10-CM | POA: Diagnosis not present

## 2018-11-15 DIAGNOSIS — Z8541 Personal history of malignant neoplasm of cervix uteri: Secondary | ICD-10-CM | POA: Diagnosis not present

## 2018-11-15 DIAGNOSIS — Y92002 Bathroom of unspecified non-institutional (private) residence single-family (private) house as the place of occurrence of the external cause: Secondary | ICD-10-CM | POA: Diagnosis not present

## 2018-11-15 DIAGNOSIS — Y939 Activity, unspecified: Secondary | ICD-10-CM | POA: Diagnosis not present

## 2018-11-15 DIAGNOSIS — S199XXA Unspecified injury of neck, initial encounter: Secondary | ICD-10-CM | POA: Diagnosis present

## 2018-11-15 DIAGNOSIS — S0003XA Contusion of scalp, initial encounter: Secondary | ICD-10-CM | POA: Insufficient documentation

## 2018-11-15 DIAGNOSIS — I10 Essential (primary) hypertension: Secondary | ICD-10-CM | POA: Insufficient documentation

## 2018-11-15 DIAGNOSIS — Z79899 Other long term (current) drug therapy: Secondary | ICD-10-CM | POA: Insufficient documentation

## 2018-11-15 MED ORDER — ACETAMINOPHEN 500 MG PO TABS
1000.0000 mg | ORAL_TABLET | Freq: Once | ORAL | Status: AC
  Administered 2018-11-15: 1000 mg via ORAL
  Filled 2018-11-15: qty 2

## 2018-11-15 NOTE — ED Notes (Signed)
C-collar applied to pt. Pt tolerated well.

## 2018-11-15 NOTE — ED Notes (Signed)
Patient transported to CT 

## 2018-11-15 NOTE — ED Triage Notes (Signed)
Pt fell off stool at home,hit her head but no LOC. Left shoulder, arm and neck pain. Pt is on blood thinners, A&Ox4. Hx of HTN, did take xanax around 1550. 140/92, 76, 99% on RA.

## 2018-11-15 NOTE — ED Provider Notes (Signed)
[facility] Emergency Department Provider Note  ____________________________________________  Time seen: Approximately 6:22 PM  I have reviewed the triage vital signs and the nursing notes.   HISTORY  Chief Complaint Fall    HPI Wanda Cooper is a 72 y.o. female with a history of uterine cancer, CAD, GERD, hypertension who was in her usual state of health today until about 3 PM when she was sitting on a stool in her bathroom, slipped and fell off of it hitting her right head against a door frame.  No loss of consciousness but afterwards she complains of left neck pain, radiating into the left shoulder and biceps area and associated with paresthesia in all 5 fingertips.  Denies weakness.  Complains of minor right-sided headache.  No vision changes.  She is on Plavix but no other anticoagulants.  Pain is been constant without aggravating or alleviating factors, moderate to severe.  Burning and sharp.      Past Medical History:  Diagnosis Date  . Allergic rhinitis    chronic/seasonal  . Anxiety   . Arthritis    osteo, NECK  . Cancer ([place])    uterine  . Cancer ([place])    thyroid  . Coronary artery disease   . Depression   . GERD (gastroesophageal reflux disease)   . Hypertension    CONTROLLED ON MEDS  . Hypothyroidism   . Idiopathic neuropathy   . Insomnia   . Lymphedema   . Osteomyelitis of toe of right foot ([place])   . Wears dentures    PARTIAL TOP, FULL lower     There are no active problems to display for this patient.    Past Surgical History:  Procedure Laterality Date  . ABDOMINAL HYSTERECTOMY    . ARTHRODESIS METATARSALPHALANGEAL JOINT (MTPJ) Right 01/16/2017   Procedure: ARTHRODESIS METATARSALPHALANGEAL JOINT (MTPJ);  Surgeon: Albertine Patricia, DPM;  Location: [place];  Service: Podiatry;  Laterality: Right;  LOCAL/W LMA  . BREAST BIOPSY Right 05/09/14   bx/clip-neg  . CARDIAC CATHETERIZATION  2015   1 STENT/ DR MATTHEW ROE,  CARDIOLOGIST AT [facility]  . CHOLECYSTECTOMY    . HAMMER TOE SURGERY Right 01/16/2017   Procedure: HAMMER TOE CORRECTION 4th and 5th toes and overlapping hammer toe correction 2nd toe;  Surgeon: Albertine Patricia, DPM;  Location: [place];  Service: Podiatry;  Laterality: Right;  . KNEE ARTHROPLASTY Right   . METATARSAL OSTEOTOMY Right 03/14/2016   Procedure: METATARSAL OSTEOTOMY RT 3RD TOE INTERPHALANGEAL JOINT ;  Surgeon: Albertine Patricia, DPM;  Location: [place];  Service: Podiatry;  Laterality: Right;  IVA WITH LOCAL  . THYROIDECTOMY    . TONSILLECTOMY       Prior to Admission medications   Medication Sig Start Date End Date Taking? Authorizing Provider  ALPRAZolam Duanne Moron) 0.25 MG tablet Take 0.25 mg by mouth at bedtime as needed for sleep.    [provider]  amLODipine (NORVASC) 2.5 MG tablet Take 2.5 mg by mouth daily. AM    [provider]  aspirin 81 MG tablet Take 81 mg by mouth daily. AM    [provider]  atorvastatin (LIPITOR) 40 MG tablet Take 40 mg by mouth daily. PM    [provider]  bisacodyl (DULCOLAX) 5 MG EC tablet Take 5 mg by mouth daily as needed for moderate constipation.    [provider]  clopidogrel (PLAVIX) 75 MG tablet Take 75 mg by mouth daily. AM    [provider]  docusate sodium (COLACE) 100 MG capsule Take 100 mg by mouth 2 (two) times daily.    [provider]  doxycycline (VIBRA-TABS) 100 MG tablet Take 100 mg by mouth 2 (two) times daily.    [provider]  levothyroxine (SYNTHROID, LEVOTHROID) 125 MCG tablet Take 125 mcg by mouth daily before breakfast.    [provider]  metoprolol succinate (TOPROL-XL) 50 MG 24 hr tablet Take 50 mg by mouth 2 (two) times daily. Take with or immediately following a meal.    [provider]  nitroGLYCERIN (NITROSTAT) 0.4 MG SL tablet Place 0.4 mg under the tongue every 5 (five) minutes as needed for chest pain.     [provider]  oxyCODONE-acetaminophen (PERCOCET) 7.5-325 MG tablet Take 1 tablet by mouth every 4 (four) hours as needed for severe pain. 01/16/17   Troxler, Rodman Key, DPM  ranitidine (ZANTAC) 300 MG tablet Take 300 mg by mouth 2 (two) times daily.    [provider]     Allergies Amoxicillin; Augmentin [amoxicillin-pot clavulanate]; Macrobid [nitrofurantoin monohyd macro]; Penicillins; Septra [sulfamethoxazole-trimethoprim]; and Sulfur   Family History  Problem Relation Age of Onset  . Breast cancer Maternal Grandmother   . Diabetes Maternal Grandmother   . COPD Mother   . Ulcers Mother   . Hypertension Father   . Diabetes type II Father   . CAD Father   . Stroke Father   . Cancer - Colon Maternal Aunt   . Breast cancer Maternal Aunt        mat great aunt  . Diabetes Paternal Grandmother     Social History Social History   Tobacco Use  . Smoking status: Former Smoker    Packs/day: 1.00    Years: 32.00    Pack years: 32.00    Types: Cigarettes    Last attempt to quit: 08/07/1992    Years since quitting: 26.2  . Smokeless tobacco: Never Used  Substance Use Topics  . Alcohol use: No    Comment: [employer]  . Drug use: No    Review of Systems  Constitutional:   No fever or chills.  ENT:   No sore throat. No rhinorrhea. Cardiovascular:   No chest pain or syncope. Respiratory:   No dyspnea or cough. Gastrointestinal:   Negative for abdominal pain, vomiting and diarrhea.  Musculoskeletal: Neck pain and left arm pain as above. All other systems reviewed and are negative except as documented above in ROS and HPI.  ____________________________________________   PHYSICAL EXAM:  VITAL SIGNS: ED Triage Vitals  Enc Vitals Group     BP 11/15/18 1624 (!) 153/86     Pulse Rate 11/15/18 1624 76     Resp 11/15/18 1625 18     Temp 11/15/18 1625 97.8 F (36.6 C)     Temp Source 11/15/18 1625 Oral     SpO2 11/15/18 1624 97 %     Weight  11/15/18 1627 167 lb (75.8 kg)     Height 11/15/18 1627 5\' 4"  (1.626 m)     Head Circumference --      Peak Flow --      Pain Score 11/15/18 1627 9     Pain Loc --      Pain Edu? --      Excl. in [place]? --     Vital signs reviewed, nursing assessments reviewed.   Constitutional:   Alert and oriented. Non-toxic appearance. Eyes:   Conjunctivae are normal. EOMI. PERRL. ENT  Head:   Normocephalic with contusion and erythema of the right parietal scalp without laceration..      Nose:   No congestion/rhinnorhea.  No epistaxis      Mouth/Throat:   MMM, no pharyngeal erythema. No peritonsillar mass.       Neck:   No meningismus. Full ROM.  Mild midline tenderness of the lower cervical spine without step-off.  No crepitus. Hematological/Lymphatic/Immunilogical:   No cervical lymphadenopathy. Cardiovascular:   RRR. Symmetric bilateral radial and DP pulses.  No murmurs. Cap refill less than 2 seconds. Respiratory:   Normal respiratory effort without tachypnea/retractions. Breath sounds are clear and equal bilaterally. No wheezes/rales/rhonchi. Gastrointestinal:   Soft and nontender. Non distended. There is no CVA tenderness.  No rebound, rigidity, or guarding.  Musculoskeletal:   Normal range of motion in all extremities. No joint effusions.  No lower extremity tenderness.  No edema. Neurologic:   Normal speech and language.  Motor grossly intact. Subjectively diminished sensation in all 5 fingertips of the left Skin:    Skin is warm, dry and intact. No rash noted.  No petechiae, purpura, or bullae.  ____________________________________________    LABS (pertinent positives/negatives) (all labs ordered are listed, but only abnormal results are displayed) Labs Reviewed - No data to display ____________________________________________   EKG    ____________________________________________    RADIOLOGY  Ct Head Wo Contrast  Result Date: 11/15/2018 CLINICAL DATA:  [place] off a  stool at home and hit her head. Neck pain. EXAM: CT HEAD WITHOUT CONTRAST CT CERVICAL SPINE WITHOUT CONTRAST TECHNIQUE: Multidetector CT imaging of the head and cervical spine was performed following the standard protocol without intravenous contrast. Multiplanar CT image reconstructions of the cervical spine were also generated. COMPARISON:  None. FINDINGS: CT HEAD FINDINGS Brain: There is no evidence for acute hemorrhage, hydrocephalus, mass lesion, or abnormal extra-axial fluid collection. No definite CT evidence for acute infarction. Vascular: No hyperdense vessel or unexpected calcification. Skull: No evidence for fracture. No worrisome lytic or sclerotic lesion. Sinuses/Orbits: The visualized paranasal sinuses and mastoid air cells are clear. Visualized portions of the globes and intraorbital fat are unremarkable. Other: None. CT CERVICAL SPINE FINDINGS Alignment: No subluxation. Mild straightening of normal cervical lordosis. Skull base and vertebrae: Nondisplaced fracture identified in the left C6 lamina (axial image 43/series 3 and well seen on coronal image 25 of series 11). No other cervical spine fracture evident. Anterior bridging osteophytes noted at C3-4 and C4-5. Less prominent anterior bridging osteophytes are visible at C5-6 and C6-7. Soft tissues and spinal canal: No prevertebral fluid or swelling. No visible canal hematoma. Disc levels: Loss of disc height noted C6-7. Facet osteoarthritis noted bilaterally at C7-T1. Upper chest: 3 mm right upper lobe pulmonary nodule (74/3). 3 mm nodule identified in the anterior left upper lobe on 81/3. Other: None. IMPRESSION: 1. No acute intracranial abnormality. 2. Nondisplaced fracture of the left C6 lamina. No subluxation at this level. No other cervical spine fracture evident. 3. 3 mm pulmonary nodules identified in each lung apex. No follow-up needed if patient is low-risk (and has no known or suspected primary neoplasm). Non-contrast chest CT can be  considered in 12 months if patient is high-risk. This recommendation follows the consensus statement: Guidelines for Management of Incidental Pulmonary Nodules Detected on CT Images: From the Fleischner Society 2017; Radiology 2017; 284:228-243. Results were called by me at the time of interpretation on 11/15/2018 at 5:41 pm to Dr. Carrie Mew , who verbally acknowledged these results. Electronically Signed  By: Misty Stanley M.D.   On: 11/15/2018 17:43   Ct Cervical Spine Wo Contrast  Result Date: 11/15/2018 CLINICAL DATA:  [place] off a stool at home and hit her head. Neck pain. EXAM: CT HEAD WITHOUT CONTRAST CT CERVICAL SPINE WITHOUT CONTRAST TECHNIQUE: Multidetector CT imaging of the head and cervical spine was performed following the standard protocol without intravenous contrast. Multiplanar CT image reconstructions of the cervical spine were also generated. COMPARISON:  None. FINDINGS: CT HEAD FINDINGS Brain: There is no evidence for acute hemorrhage, hydrocephalus, mass lesion, or abnormal extra-axial fluid collection. No definite CT evidence for acute infarction. Vascular: No hyperdense vessel or unexpected calcification. Skull: No evidence for fracture. No worrisome lytic or sclerotic lesion. Sinuses/Orbits: The visualized paranasal sinuses and mastoid air cells are clear. Visualized portions of the globes and intraorbital fat are unremarkable. Other: None. CT CERVICAL SPINE FINDINGS Alignment: No subluxation. Mild straightening of normal cervical lordosis. Skull base and vertebrae: Nondisplaced fracture identified in the left C6 lamina (axial image 43/series 3 and well seen on coronal image 25 of series 11). No other cervical spine fracture evident. Anterior bridging osteophytes noted at C3-4 and C4-5. Less prominent anterior bridging osteophytes are visible at C5-6 and C6-7. Soft tissues and spinal canal: No prevertebral fluid or swelling. No visible canal hematoma. Disc levels: Loss of disc height  noted C6-7. Facet osteoarthritis noted bilaterally at C7-T1. Upper chest: 3 mm right upper lobe pulmonary nodule (74/3). 3 mm nodule identified in the anterior left upper lobe on 81/3. Other: None. IMPRESSION: 1. No acute intracranial abnormality. 2. Nondisplaced fracture of the left C6 lamina. No subluxation at this level. No other cervical spine fracture evident. 3. 3 mm pulmonary nodules identified in each lung apex. No follow-up needed if patient is low-risk (and has no known or suspected primary neoplasm). Non-contrast chest CT can be considered in 12 months if patient is high-risk. This recommendation follows the consensus statement: Guidelines for Management of Incidental Pulmonary Nodules Detected on CT Images: From the Fleischner Society 2017; Radiology 2017; 284:228-243. Results were called by me at the time of interpretation on 11/15/2018 at 5:41 pm to Dr. Carrie Mew , who verbally acknowledged these results. Electronically Signed   By: Misty Stanley M.D.   On: 11/15/2018 17:43    ____________________________________________   PROCEDURES .Critical Care Performed by: Carrie Mew, MD Authorized by: Carrie Mew, MD   Critical care provider statement:    Critical care time (minutes):  35   Critical care time was exclusive of:  Separately billable procedures and treating other patients   Critical care was necessary to treat or prevent imminent or life-threatening deterioration of the following conditions:  Trauma and CNS failure or compromise   Critical care was time spent personally by me on the following activities:  Development of treatment plan with patient or surrogate, discussions with consultants, evaluation of patient's response to treatment, examination of patient, obtaining history from patient or surrogate, ordering and performing treatments and interventions, ordering and review of laboratory studies, ordering and review of radiographic studies, pulse oximetry,  re-evaluation of patient's condition and review of old charts    ____________________________________________  DIFFERENTIAL DIAGNOSIS   Intracranial hemorrhage, C-spine fracture  CLINICAL IMPRESSION / [place] / ED COURSE  Pertinent labs & imaging results that were available during my care of the patient were reviewed by me and considered in my medical decision making (see chart for details).    Patient presents after head trauma with neck  pain radiating to the left arm, possibly radicular.  CT scan of the head and neck was obtained, discussed with radiologist which does show a C6 left lamina fracture, nondisplaced.  This corresponds to the radiation pattern of her neck pain which does seem to go into the C6 dermatome of the left arm.  Hard to account for the numbness in all 5 fingertips.  Does not have any motor weakness.  CT head is unremarkable.  Discussed with the [facility] transfer center at 6:25 PM, awaiting callback from specialist.   ----------------------------------------- 7:06 PM on 11/15/2018 -----------------------------------------  Patient accepted for transfer to [facility] under trauma designation.  Will facilitate transport as soon as possible.     ____________________________________________   FINAL CLINICAL IMPRESSION(S) / ED DIAGNOSES    Final diagnoses:  Closed nondisplaced fracture of sixth cervical vertebra, unspecified fracture morphology, initial encounter [facility])     ED Discharge Orders    None      Portions of this note were generated with dragon dictation software. Dictation errors may occur despite best attempts at proofreading.    Carrie Mew, MD 11/15/18 [id number]

## 2019-04-07 ENCOUNTER — Other Ambulatory Visit: Payer: Self-pay | Admitting: Family Medicine

## 2019-04-07 DIAGNOSIS — Z1231 Encounter for screening mammogram for malignant neoplasm of breast: Secondary | ICD-10-CM

## 2019-06-29 ENCOUNTER — Encounter (INDEPENDENT_AMBULATORY_CARE_PROVIDER_SITE_OTHER): Payer: Self-pay

## 2019-06-29 ENCOUNTER — Ambulatory Visit
Admission: RE | Admit: 2019-06-29 | Discharge: 2019-06-29 | Disposition: A | Discharge status: Home / Self Care | Payer: Medicare Other | Source: Ambulatory Visit | Attending: Family Medicine | Admitting: Family Medicine

## 2019-06-29 ENCOUNTER — Other Ambulatory Visit: Payer: Self-pay

## 2019-06-29 DIAGNOSIS — Z1231 Encounter for screening mammogram for malignant neoplasm of breast: Secondary | ICD-10-CM | POA: Diagnosis present

## 2020-07-06 ENCOUNTER — Other Ambulatory Visit: Payer: Self-pay | Admitting: Family Medicine

## 2020-08-18 ENCOUNTER — Other Ambulatory Visit: Payer: Self-pay | Admitting: Family Medicine

## 2020-08-18 ENCOUNTER — Other Ambulatory Visit: Payer: Self-pay | Admitting: Hematology and Oncology

## 2020-08-18 DIAGNOSIS — Z1231 Encounter for screening mammogram for malignant neoplasm of breast: Secondary | ICD-10-CM

## 2020-09-13 ENCOUNTER — Ambulatory Visit
Admission: RE | Admit: 2020-09-13 | Discharge: 2020-09-13 | Disposition: A | Discharge status: Home / Self Care | Payer: Medicare PPO | Source: Ambulatory Visit | Attending: Family Medicine | Admitting: Family Medicine

## 2020-09-13 ENCOUNTER — Other Ambulatory Visit: Payer: Self-pay

## 2020-09-13 DIAGNOSIS — Z1231 Encounter for screening mammogram for malignant neoplasm of breast: Secondary | ICD-10-CM | POA: Insufficient documentation

## 2021-10-08 ENCOUNTER — Other Ambulatory Visit: Payer: Self-pay | Admitting: Family Medicine

## 2021-10-08 ENCOUNTER — Other Ambulatory Visit: Payer: Self-pay | Admitting: Cardiology

## 2021-10-08 DIAGNOSIS — Z1231 Encounter for screening mammogram for malignant neoplasm of breast: Secondary | ICD-10-CM

## 2021-11-07 ENCOUNTER — Other Ambulatory Visit: Payer: Self-pay

## 2021-11-07 ENCOUNTER — Ambulatory Visit
Admission: RE | Admit: 2021-11-07 | Discharge: 2021-11-07 | Disposition: A | Discharge status: Home / Self Care | Payer: Medicare PPO | Source: Ambulatory Visit | Attending: Family Medicine | Admitting: Family Medicine

## 2021-11-07 DIAGNOSIS — Z1231 Encounter for screening mammogram for malignant neoplasm of breast: Secondary | ICD-10-CM | POA: Diagnosis not present

## 2021-12-12 ENCOUNTER — Encounter: Payer: Self-pay | Admitting: Ophthalmology

## 2021-12-27 NOTE — Discharge Instructions (Signed)

## 2021-12-31 ENCOUNTER — Other Ambulatory Visit: Payer: Self-pay

## 2021-12-31 ENCOUNTER — Encounter
Admission: RE | Disposition: A | Discharge status: Home / Self Care | Payer: Self-pay | Source: Home / Self Care | Attending: Ophthalmology

## 2021-12-31 ENCOUNTER — Ambulatory Visit: Payer: Medicare PPO | Admitting: Anesthesiology

## 2021-12-31 ENCOUNTER — Ambulatory Visit
Admission: RE | Admit: 2021-12-31 | Discharge: 2021-12-31 | Disposition: A | Discharge status: Home / Self Care | Payer: Medicare PPO | Attending: Ophthalmology | Admitting: Ophthalmology

## 2021-12-31 ENCOUNTER — Encounter: Payer: Self-pay | Admitting: Ophthalmology

## 2021-12-31 DIAGNOSIS — H2511 Age-related nuclear cataract, right eye: Secondary | ICD-10-CM | POA: Diagnosis present

## 2021-12-31 DIAGNOSIS — Z87891 Personal history of nicotine dependence: Secondary | ICD-10-CM | POA: Diagnosis not present

## 2021-12-31 DIAGNOSIS — F419 Anxiety disorder, unspecified: Secondary | ICD-10-CM | POA: Diagnosis not present

## 2021-12-31 DIAGNOSIS — I10 Essential (primary) hypertension: Secondary | ICD-10-CM | POA: Diagnosis not present

## 2021-12-31 DIAGNOSIS — K219 Gastro-esophageal reflux disease without esophagitis: Secondary | ICD-10-CM | POA: Diagnosis not present

## 2021-12-31 DIAGNOSIS — I2510 Atherosclerotic heart disease of native coronary artery without angina pectoris: Secondary | ICD-10-CM | POA: Insufficient documentation

## 2021-12-31 HISTORY — DX: Anemia, unspecified: D64.9

## 2021-12-31 HISTORY — PX: CATARACT EXTRACTION W/PHACO: SHX586

## 2021-12-31 SURGERY — PHACOEMULSIFICATION, CATARACT, WITH IOL INSERTION
Anesthesia: Monitor Anesthesia Care | Site: Eye | Laterality: Right

## 2021-12-31 MED ORDER — MIDAZOLAM HCL 2 MG/2ML IJ SOLN
INTRAMUSCULAR | Status: DC | PRN
  Administered 2021-12-31: 1 mg via INTRAVENOUS

## 2021-12-31 MED ORDER — ACETAMINOPHEN 160 MG/5ML PO SOLN: 325.0000 mg | ORAL | Status: DC | PRN

## 2021-12-31 MED ORDER — SIGHTPATH DOSE#1 BSS IO SOLN
INTRAOCULAR | Status: DC | PRN
  Administered 2021-12-31: 15 mL

## 2021-12-31 MED ORDER — TETRACAINE HCL 0.5 % OP SOLN
1.0000 [drp] | OPHTHALMIC | Status: DC | PRN
  Administered 2021-12-31 (×3): 1 [drp] via OPHTHALMIC

## 2021-12-31 MED ORDER — SIGHTPATH DOSE#1 SODIUM HYALURONATE 10 MG/ML IO SOLUTION
PREFILLED_SYRINGE | INTRAOCULAR | Status: DC | PRN
  Administered 2021-12-31: 0.85 mL via INTRAOCULAR

## 2021-12-31 MED ORDER — MOXIFLOXACIN HCL 0.5 % OP SOLN
OPHTHALMIC | Status: DC | PRN
  Administered 2021-12-31: 0.2 mL via OPHTHALMIC

## 2021-12-31 MED ORDER — FENTANYL CITRATE (PF) 100 MCG/2ML IJ SOLN
INTRAMUSCULAR | Status: DC | PRN
  Administered 2021-12-31: 50 ug via INTRAVENOUS

## 2021-12-31 MED ORDER — ACETAMINOPHEN 325 MG PO TABS: 325.0000 mg | ORAL_TABLET | ORAL | Status: DC | PRN

## 2021-12-31 MED ORDER — ARMC OPHTHALMIC DILATING DROPS
1.0000 "application " | OPHTHALMIC | Status: DC | PRN
  Administered 2021-12-31 (×3): 1 via OPHTHALMIC

## 2021-12-31 MED ORDER — SIGHTPATH DOSE#1 BSS IO SOLN
INTRAOCULAR | Status: DC | PRN
  Administered 2021-12-31: 72 mL via OPHTHALMIC

## 2021-12-31 MED ORDER — LIDOCAINE HCL (PF) 2 % IJ SOLN
INTRAOCULAR | Status: DC | PRN
  Administered 2021-12-31: 1 mL via INTRAOCULAR

## 2021-12-31 MED ORDER — LACTATED RINGERS IV SOLN: INTRAVENOUS | Status: DC

## 2021-12-31 MED ORDER — SIGHTPATH DOSE#1 SODIUM HYALURONATE 23 MG/ML IO SOLUTION
PREFILLED_SYRINGE | INTRAOCULAR | Status: DC | PRN
  Administered 2021-12-31: 0.6 mL via INTRAOCULAR

## 2021-12-31 SURGICAL SUPPLY — 14 items
CATARACT SUITE SIGHTPATH (MISCELLANEOUS) ×3 IMPLANT
DISSECTOR HYDRO NUCLEUS 50X22 (MISCELLANEOUS) ×3 IMPLANT
FEE CATARACT SUITE SIGHTPATH (MISCELLANEOUS) ×1 IMPLANT
GLOVE SURG GAMMEX PI TX LF 7.5 (GLOVE) ×3 IMPLANT
GLOVE SURG SYN 8.5  E (GLOVE) ×2
GLOVE SURG SYN 8.5 E (GLOVE) ×1 IMPLANT
GLOVE SURG SYN 8.5 PF PI (GLOVE) ×1 IMPLANT
LENS IOL TECNIS EYHANCE 23.0 (Intraocular Lens) ×2 IMPLANT
NDL FILTER BLUNT 18X1 1/2 (NEEDLE) ×1 IMPLANT
NEEDLE FILTER BLUNT 18X 1/2SAF (NEEDLE) ×2
NEEDLE FILTER BLUNT 18X1 1/2 (NEEDLE) ×1 IMPLANT
SYR 3ML LL SCALE MARK (SYRINGE) ×3 IMPLANT
SYR 5ML LL (SYRINGE) ×3 IMPLANT
WATER STERILE IRR 250ML POUR (IV SOLUTION) ×3 IMPLANT

## 2021-12-31 NOTE — Anesthesia Postprocedure Evaluation (Signed)
Anesthesia Post Note  Patient: Wanda Cooper  Procedure(s) Performed: CATARACT EXTRACTION PHACO AND INTRAOCULAR LENS PLACEMENT (IOC) RIGHT (Right: Eye)     Patient location during evaluation: PACU Anesthesia Type: MAC Level of consciousness: awake and alert Pain management: pain level controlled Vital Signs Assessment: post-procedure vital signs reviewed and stable Respiratory status: spontaneous breathing, nonlabored ventilation, respiratory function stable and patient connected to nasal cannula oxygen Cardiovascular status: stable and blood pressure returned to baseline Postop Assessment: no apparent nausea or vomiting Anesthetic complications: no   No notable events documented.  Trecia Rogers

## 2021-12-31 NOTE — Transfer of Care (Signed)
Immediate Anesthesia Transfer of Care Note  Patient: Wanda Cooper  Procedure(s) Performed: CATARACT EXTRACTION PHACO AND INTRAOCULAR LENS PLACEMENT (IOC) RIGHT (Right: Eye)  Patient Location: PACU  Anesthesia Type: MAC  Level of Consciousness: awake, alert  and patient cooperative  Airway and Oxygen Therapy: Patient Spontanous Breathing and Patient connected to supplemental oxygen  Post-op Assessment: Post-op Vital signs reviewed, Patient's Cardiovascular Status Stable, Respiratory Function Stable, Patent Airway and No signs of Nausea or vomiting  Post-op Vital Signs: Reviewed and stable  Complications: No notable events documented.

## 2021-12-31 NOTE — Anesthesia Preprocedure Evaluation (Signed)
Anesthesia Evaluation  Patient identified by MRN, date of birth, ID band Patient awake    Reviewed: Allergy & Precautions, H&P , NPO status , Patient's Chart, lab work & pertinent test results, reviewed documented beta blocker date and time   Airway Mallampati: II  TM Distance: >3 FB Neck ROM: full    Dental no notable dental hx. (+) Edentulous Lower, Partial Upper   Pulmonary former smoker,    Pulmonary exam normal breath sounds clear to auscultation       Cardiovascular Exercise Tolerance: Good hypertension, + CAD   Rhythm:regular Rate:Normal     Neuro/Psych Anxiety Depression negative neurological ROS  negative psych ROS   GI/Hepatic Neg liver ROS, GERD  Medicated and Controlled,  Endo/Other  negative endocrine ROS  Renal/GU negative Renal ROS  negative genitourinary   Musculoskeletal   Abdominal   Peds  Hematology negative hematology ROS (+)   Anesthesia Other Findings   Reproductive/Obstetrics negative OB ROS                             Anesthesia Physical Anesthesia Plan  ASA: 2  Anesthesia Plan: MAC   Post-op Pain Management:    Induction:   PONV Risk Score and Plan:   Airway Management Planned:   Additional Equipment:   Intra-op Plan:   Post-operative Plan:   Informed Consent: I have reviewed the patients History and Physical, chart, labs and discussed the procedure including the risks, benefits and alternatives for the proposed anesthesia with the patient or authorized representative who has indicated his/her understanding and acceptance.     Dental Advisory Given  Plan Discussed with: CRNA and Anesthesiologist  Anesthesia Plan Comments:         Anesthesia Quick Evaluation

## 2021-12-31 NOTE — H&P (Signed)
[facility]   Primary Care Physician:  Ashley Jacobs, MD Ophthalmologist: Dr. Benay Pillow  Pre-Procedure History & Physical: HPI:  Wanda Cooper is a 76 y.o. female here for cataract surgery.   Past Medical History:  Diagnosis Date   Allergic rhinitis    chronic/seasonal   Anemia    Anxiety    Arthritis    osteo, NECK   Cancer ([facility])    uterine   Cancer ([facility])    thyroid   Coronary artery disease    Depression    GERD (gastroesophageal reflux disease)    Hypertension    CONTROLLED ON MEDS   Hypothyroidism    Idiopathic neuropathy    Insomnia    Lymphedema    Osteomyelitis of toe of right foot ([place])    Wears dentures    PARTIAL TOP, FULL lower    Past Surgical History:  Procedure Laterality Date   ABDOMINAL HYSTERECTOMY     ARTHRODESIS METATARSALPHALANGEAL JOINT (MTPJ) Right 01/16/2017   Procedure: ARTHRODESIS METATARSALPHALANGEAL JOINT (MTPJ);  Surgeon: Albertine Patricia, DPM;  Location: [place];  Service: Podiatry;  Laterality: Right;  LOCAL/W LMA   BREAST BIOPSY Right 05/09/2014   bx/clip-neg   CARDIAC CATHETERIZATION  2015   1 STENT/ DR MATTHEW ROE, CARDIOLOGIST AT [facility]   CERVICAL FUSION  2019   CHOLECYSTECTOMY     HAMMER TOE SURGERY Right 01/16/2017   Procedure: HAMMER TOE CORRECTION 4th and 5th toes and overlapping hammer toe correction 2nd toe;  Surgeon: Albertine Patricia, DPM;  Location: [place];  Service: Podiatry;  Laterality: Right;   KNEE ARTHROPLASTY Right    METATARSAL OSTEOTOMY Right 03/14/2016   Procedure: METATARSAL OSTEOTOMY RT 3RD TOE INTERPHALANGEAL JOINT ;  Surgeon: Albertine Patricia, DPM;  Location: [place];  Service: Podiatry;  Laterality: Right;  IVA WITH LOCAL   THYROIDECTOMY     TONSILLECTOMY      Prior to Admission medications   Medication Sig Start Date End Date Taking? Authorizing Provider  ALPRAZolam Duanne Moron) 0.25 MG tablet Take 0.25 mg by mouth at bedtime as needed for sleep.   Yes [provider]  aspirin 81 MG tablet Take 81 mg by mouth daily. AM   Yes [provider]  atorvastatin (LIPITOR) 40 MG tablet Take 40 mg by mouth daily. PM   Yes [provider]  bisacodyl (DULCOLAX) 5 MG EC tablet Take 5 mg by mouth daily as needed for moderate constipation.   Yes [provider]  docusate sodium (COLACE) 100 MG capsule Take 100 mg by mouth 2 (two) times daily.   Yes [provider]  doxycycline (VIBRA-TABS) 100 MG tablet Take 100 mg by mouth 2 (two) times daily.   Yes [provider]  Ferrous Sulfate (IRON PO) Take 200 mg by mouth.   Yes [provider]  levothyroxine (SYNTHROID, LEVOTHROID) 125 MCG tablet Take 125 mcg by mouth daily before breakfast.   Yes [provider]  metoprolol succinate (TOPROL-XL) 50 MG 24 hr tablet Take 50 mg by mouth 2 (two) times daily. Take with or immediately following a meal.   Yes [provider]  nitroGLYCERIN (NITROSTAT) 0.4 MG SL tablet Place 0.4 mg under the tongue every 5 (five) minutes as needed for chest pain.   Yes [provider]  nystatin cream (MYCOSTATIN) Apply 1 application topically 2 (two) times daily.   Yes [provider]  omeprazole (PRILOSEC) 20 MG capsule Take 20 mg by mouth daily.  Yes [provider]  amLODipine (NORVASC) 2.5 MG tablet Take 2.5 mg by mouth daily. AM Patient not taking: Reported on 12/12/2021    [provider]  clopidogrel (PLAVIX) 75 MG tablet Take 75 mg by mouth daily. AM Patient not taking: Reported on 12/12/2021    [provider]  oxyCODONE-acetaminophen (PERCOCET) 7.5-325 MG tablet Take 1 tablet by mouth every 4 (four) hours as needed for severe pain. Patient not taking: Reported on 12/31/2021 01/16/17   Albertine Patricia, DPM  ranitidine (ZANTAC) 300 MG tablet Take 300 mg by mouth 2 (two) times daily. Patient not taking: Reported on 12/12/2021    [provider]    Allergies as of  10/04/2021 - Review Complete 11/15/2018  Allergen Reaction Noted   Amoxicillin Nausea Only 03/09/2016   Augmentin [amoxicillin-pot clavulanate] Nausea Only 03/09/2016   Elemental sulfur  03/09/2016   Macrobid [nitrofurantoin monohyd macro] Nausea Only 03/09/2016   Penicillins Nausea And Vomiting 03/09/2016   Septra [sulfamethoxazole-trimethoprim] Other (See Comments) 03/12/2016    Family History  Problem Relation Age of Onset   Breast cancer Maternal Grandmother    Diabetes Maternal Grandmother    COPD Mother    Ulcers Mother    Hypertension Father    Diabetes type II Father    CAD Father    Stroke Father    Cancer - Colon Maternal Aunt    Breast cancer Maternal Aunt        mat great aunt   Diabetes Paternal Grandmother     Social History   Socioeconomic History   Marital status: Married    Spouse name: Not on file   Number of children: Not on file   Years of education: Not on file   Highest education level: Not on file  Occupational History   Not on file  Tobacco Use   Smoking status: Former    Packs/day: 1.00    Years: 32.00    Pack years: 32.00    Types: Cigarettes    Quit date: 08/07/1992    Years since quitting: 29.4   Smokeless tobacco: Never  Substance and Sexual Activity   Alcohol use: No    Comment: [employer]   Drug use: No   Sexual activity: Not on file  Other Topics Concern   Not on file  Social History Narrative   Not on file   Social Determinants of Health   Financial Resource Strain: Not on file  Food Insecurity: Not on file  Transportation Needs: Not on file  Physical Activity: Not on file  Stress: Not on file  Social Connections: Not on file  Intimate Partner Violence: Not on file    Review of Systems: See HPI, otherwise negative ROS  Physical Exam: BP (!) 157/83    Pulse 63    Temp (!) 97.5 F (36.4 C) (Temporal)    Ht 5\' 4"  (1.626 m)    Wt 77.6 kg    SpO2 97%    BMI 29.35 kg/m  General:   Alert, cooperative in  NAD Head:  Normocephalic and atraumatic. Respiratory:  Normal work of breathing. Cardiovascular:  RRR  Impression/Plan: Wanda Cooper is here for cataract surgery.  Risks, benefits, limitations, and alternatives regarding cataract surgery have been reviewed with the patient.  Questions have been answered.  All parties agreeable.   Benay Pillow, MD  12/31/2021, 10:11 AM

## 2021-12-31 NOTE — Op Note (Signed)
OPERATIVE NOTE  Wanda Cooper [id number] 12/31/2021   PREOPERATIVE DIAGNOSIS:  Nuclear sclerotic cataract right eye.  H25.11   POSTOPERATIVE DIAGNOSIS:    Nuclear sclerotic cataract right eye.     PROCEDURE:  Phacoemusification with posterior chamber intraocular lens placement of the right eye   LENS:   Implant Name Type Inv. Item Serial No. Manufacturer Lot No. LRB No. Used Action  LENS IOL TECNIS EYHANCE 23.0 - [id number] Intraocular Lens LENS IOL TECNIS EYHANCE 23.0 [id number] [employer]  Right 1 Implanted       Procedure(s) with comments: CATARACT EXTRACTION PHACO AND INTRAOCULAR LENS PLACEMENT (IOC) RIGHT (Right) - 3.08 00:23.6  DIB00 +23.0   ULTRASOUND TIME: 0 minutes 23 seconds.  CDE 3.08   SURGEON:  Benay Pillow, MD, MPH  ANESTHESIOLOGIST: Anesthesiologist: Rochel Brome, MD CRNA: Silvana Newness, CRNA   ANESTHESIA:  Topical with tetracaine drops augmented with 1% preservative-free intracameral lidocaine.  ESTIMATED BLOOD LOSS: less than 1 mL.   COMPLICATIONS:  None.   DESCRIPTION OF PROCEDURE:  The patient was identified in the holding room and transported to the operating room and placed in the supine position under the operating microscope.  The right eye was identified as the operative eye and it was prepped and draped in the usual sterile ophthalmic fashion.   A 1.0 millimeter clear-corneal paracentesis was made at the 10:30 position. 0.5 ml of preservative-free 1% lidocaine with epinephrine was injected into the anterior chamber.  The anterior chamber was filled with Healon 5 viscoelastic.  A 2.4 millimeter keratome was used to make a near-clear corneal incision at the 8:00 position.  A curvilinear capsulorrhexis was made with a cystotome and capsulorrhexis forceps.  Balanced salt solution was used to hydrodissect and hydrodelineate the nucleus.   Phacoemulsification was then used in stop and chop fashion to remove the lens nucleus and epinucleus.  The remaining  cortex was then removed using the irrigation and aspiration handpiece. Healon was then placed into the capsular bag to distend it for lens placement.  A lens was then injected into the capsular bag.  The remaining viscoelastic was aspirated.   Wounds were hydrated with balanced salt solution.  The anterior chamber was inflated to a physiologic pressure with balanced salt solution.   Intracameral vigamox 0.1 mL undiluted was injected into the eye and a drop placed onto the ocular surface.  No wound leaks were noted.  The patient was taken to the recovery room in stable condition without complications of anesthesia or surgery  Benay Pillow 12/31/2021, 10:37 AM

## 2022-01-01 ENCOUNTER — Encounter: Payer: Self-pay | Admitting: Ophthalmology

## 2022-01-10 NOTE — Discharge Instructions (Signed)

## 2022-01-14 ENCOUNTER — Encounter
Admission: RE | Disposition: A | Discharge status: Home / Self Care | Payer: Self-pay | Source: Home / Self Care | Attending: Ophthalmology

## 2022-01-14 ENCOUNTER — Other Ambulatory Visit: Payer: Self-pay

## 2022-01-14 ENCOUNTER — Ambulatory Visit
Admission: RE | Admit: 2022-01-14 | Discharge: 2022-01-14 | Disposition: A | Discharge status: Home / Self Care | Payer: Medicare PPO | Attending: Ophthalmology | Admitting: Ophthalmology

## 2022-01-14 ENCOUNTER — Ambulatory Visit: Payer: Medicare PPO | Admitting: Anesthesiology

## 2022-01-14 DIAGNOSIS — E039 Hypothyroidism, unspecified: Secondary | ICD-10-CM | POA: Diagnosis not present

## 2022-01-14 DIAGNOSIS — I2510 Atherosclerotic heart disease of native coronary artery without angina pectoris: Secondary | ICD-10-CM | POA: Diagnosis not present

## 2022-01-14 DIAGNOSIS — H2512 Age-related nuclear cataract, left eye: Secondary | ICD-10-CM | POA: Insufficient documentation

## 2022-01-14 DIAGNOSIS — K219 Gastro-esophageal reflux disease without esophagitis: Secondary | ICD-10-CM | POA: Diagnosis not present

## 2022-01-14 DIAGNOSIS — F419 Anxiety disorder, unspecified: Secondary | ICD-10-CM | POA: Insufficient documentation

## 2022-01-14 DIAGNOSIS — I10 Essential (primary) hypertension: Secondary | ICD-10-CM | POA: Diagnosis not present

## 2022-01-14 DIAGNOSIS — Z87891 Personal history of nicotine dependence: Secondary | ICD-10-CM | POA: Insufficient documentation

## 2022-01-14 DIAGNOSIS — F32A Depression, unspecified: Secondary | ICD-10-CM | POA: Insufficient documentation

## 2022-01-14 HISTORY — PX: CATARACT EXTRACTION W/PHACO: SHX586

## 2022-01-14 SURGERY — PHACOEMULSIFICATION, CATARACT, WITH IOL INSERTION
Anesthesia: Monitor Anesthesia Care | Site: Eye | Laterality: Left

## 2022-01-14 MED ORDER — SIGHTPATH DOSE#1 BSS IO SOLN
INTRAOCULAR | Status: DC | PRN
  Administered 2022-01-14: 15 mL

## 2022-01-14 MED ORDER — SIGHTPATH DOSE#1 SODIUM HYALURONATE 10 MG/ML IO SOLUTION
PREFILLED_SYRINGE | INTRAOCULAR | Status: DC | PRN
  Administered 2022-01-14: 0.85 mL via INTRAOCULAR

## 2022-01-14 MED ORDER — SIGHTPATH DOSE#1 BSS IO SOLN
INTRAOCULAR | Status: DC | PRN
  Administered 2022-01-14: 68 mL via OPHTHALMIC

## 2022-01-14 MED ORDER — ARMC OPHTHALMIC DILATING DROPS
1.0000 "application " | OPHTHALMIC | Status: DC | PRN
  Administered 2022-01-14 (×3): 1 via OPHTHALMIC

## 2022-01-14 MED ORDER — MIDAZOLAM HCL 2 MG/2ML IJ SOLN
INTRAMUSCULAR | Status: DC | PRN
  Administered 2022-01-14: 2 mg via INTRAVENOUS

## 2022-01-14 MED ORDER — TETRACAINE HCL 0.5 % OP SOLN
1.0000 [drp] | OPHTHALMIC | Status: DC | PRN
  Administered 2022-01-14 (×3): 1 [drp] via OPHTHALMIC

## 2022-01-14 MED ORDER — ACETAMINOPHEN 160 MG/5ML PO SOLN: 325.0000 mg | Freq: Once | ORAL | Status: DC

## 2022-01-14 MED ORDER — LACTATED RINGERS IV SOLN: INTRAVENOUS | Status: DC

## 2022-01-14 MED ORDER — MOXIFLOXACIN HCL 0.5 % OP SOLN
OPHTHALMIC | Status: DC | PRN
  Administered 2022-01-14: 0.2 mL via OPHTHALMIC

## 2022-01-14 MED ORDER — SIGHTPATH DOSE#1 SODIUM HYALURONATE 23 MG/ML IO SOLUTION
PREFILLED_SYRINGE | INTRAOCULAR | Status: DC | PRN
  Administered 2022-01-14: 0.6 mL via INTRAOCULAR

## 2022-01-14 MED ORDER — ACETAMINOPHEN 325 MG PO TABS: 325.0000 mg | ORAL_TABLET | Freq: Once | ORAL | Status: DC

## 2022-01-14 MED ORDER — FENTANYL CITRATE (PF) 100 MCG/2ML IJ SOLN
INTRAMUSCULAR | Status: DC | PRN
Start: 2022-01-14 — End: 2022-01-14
  Administered 2022-01-14: 50 ug via INTRAVENOUS

## 2022-01-14 MED ORDER — LIDOCAINE HCL (PF) 2 % IJ SOLN
INTRAOCULAR | Status: DC | PRN
  Administered 2022-01-14: 1 mL via INTRAOCULAR

## 2022-01-14 SURGICAL SUPPLY — 14 items
CATARACT SUITE SIGHTPATH (MISCELLANEOUS) ×2 IMPLANT
DISSECTOR HYDRO NUCLEUS 50X22 (MISCELLANEOUS) ×2 IMPLANT
FEE CATARACT SUITE SIGHTPATH (MISCELLANEOUS) ×1 IMPLANT
GLOVE SURG GAMMEX PI TX LF 7.5 (GLOVE) ×2 IMPLANT
GLOVE SURG SYN 8.5  E (GLOVE) ×1
GLOVE SURG SYN 8.5 E (GLOVE) ×1 IMPLANT
GLOVE SURG SYN 8.5 PF PI (GLOVE) ×1 IMPLANT
LENS IOL TECNIS EYHANCE 22.5 (Intraocular Lens) ×1 IMPLANT
NDL FILTER BLUNT 18X1 1/2 (NEEDLE) ×1 IMPLANT
NEEDLE FILTER BLUNT 18X 1/2SAF (NEEDLE) ×1
NEEDLE FILTER BLUNT 18X1 1/2 (NEEDLE) ×1 IMPLANT
SYR 3ML LL SCALE MARK (SYRINGE) ×2 IMPLANT
SYR 5ML LL (SYRINGE) ×2 IMPLANT
WATER STERILE IRR 250ML POUR (IV SOLUTION) ×2 IMPLANT

## 2022-01-14 NOTE — Anesthesia Preprocedure Evaluation (Signed)
Anesthesia Evaluation  Patient identified by MRN, date of birth, ID band Patient awake    Reviewed: Allergy & Precautions, H&P , NPO status , Patient's Chart, lab work & pertinent test results, reviewed documented beta blocker date and time   Airway Mallampati: II  TM Distance: >3 FB Neck ROM: full    Dental no notable dental hx. (+) Edentulous Lower, Partial Upper   Pulmonary former smoker,    Pulmonary exam normal breath sounds clear to auscultation       Cardiovascular Exercise Tolerance: Good hypertension, + CAD  Normal cardiovascular exam Rhythm:regular Rate:Normal     Neuro/Psych Anxiety Depression negative neurological ROS  negative psych ROS   GI/Hepatic Neg liver ROS, GERD  Medicated and Controlled,  Endo/Other  Hypothyroidism   Renal/GU negative Renal ROS  negative genitourinary   Musculoskeletal   Abdominal   Peds  Hematology negative hematology ROS (+)   Anesthesia Other Findings   Reproductive/Obstetrics negative OB ROS                             Anesthesia Physical  Anesthesia Plan  ASA: 2  Anesthesia Plan: MAC   Post-op Pain Management: Minimal or no pain anticipated   Induction:   PONV Risk Score and Plan: 2 and Treatment may vary due to age or medical condition, TIVA and Midazolam  Airway Management Planned:   Additional Equipment:   Intra-op Plan:   Post-operative Plan:   Informed Consent: I have reviewed the patients History and Physical, chart, labs and discussed the procedure including the risks, benefits and alternatives for the proposed anesthesia with the patient or authorized representative who has indicated his/her understanding and acceptance.     Dental Advisory Given  Plan Discussed with: CRNA  Anesthesia Plan Comments:         Anesthesia Quick Evaluation

## 2022-01-14 NOTE — Anesthesia Procedure Notes (Signed)
Procedure Name: MAC Date/Time: 01/14/2022 11:06 AM Performed by: Jeannene Patella, CRNA Pre-anesthesia Checklist: Patient identified, Emergency Drugs available, Suction available, Timeout performed and Patient being monitored Patient Re-evaluated:Patient Re-evaluated prior to induction Oxygen Delivery Method: Nasal cannula Placement Confirmation: positive ETCO2

## 2022-01-14 NOTE — Transfer of Care (Signed)
Immediate Anesthesia Transfer of Care Note  Patient: Wanda Cooper  Procedure(s) Performed: CATARACT EXTRACTION PHACO AND INTRAOCULAR LENS PLACEMENT (IOC) LEFT (Left: Eye)  Patient Location: PACU  Anesthesia Type: MAC  Level of Consciousness: awake, alert  and patient cooperative  Airway and Oxygen Therapy: Patient Spontanous Breathing and Patient connected to supplemental oxygen  Post-op Assessment: Post-op Vital signs reviewed, Patient's Cardiovascular Status Stable, Respiratory Function Stable, Patent Airway and No signs of Nausea or vomiting  Post-op Vital Signs: Reviewed and stable  Complications: No notable events documented.

## 2022-01-14 NOTE — Anesthesia Postprocedure Evaluation (Signed)
Anesthesia Post Note  Patient: Wanda Cooper  Procedure(s) Performed: CATARACT EXTRACTION PHACO AND INTRAOCULAR LENS PLACEMENT (IOC) LEFT (Left: Eye)     Patient location during evaluation: PACU Anesthesia Type: MAC Level of consciousness: awake and alert and oriented Pain management: satisfactory to patient Vital Signs Assessment: post-procedure vital signs reviewed and stable Respiratory status: spontaneous breathing, nonlabored ventilation and respiratory function stable Cardiovascular status: blood pressure returned to baseline and stable Postop Assessment: Adequate PO intake and No signs of nausea or vomiting Anesthetic complications: no   No notable events documented.  Raliegh Ip

## 2022-01-14 NOTE — H&P (Signed)
[facility]   Primary Care Physician:  Wanda Jacobs, MD Ophthalmologist: Dr. Benay Cooper  Pre-Procedure History & Physical: HPI:  Wanda Cooper is a 76 y.o. female here for cataract surgery.   Past Medical History:  Diagnosis Date   Allergic rhinitis    chronic/seasonal   Anemia    Anxiety    Arthritis    osteo, NECK   Cancer (HCC)    uterine   Cancer (HCC)    thyroid   Coronary artery disease    Depression    GERD (gastroesophageal reflux disease)    Hypertension    CONTROLLED ON MEDS   Hypothyroidism    Idiopathic neuropathy    Insomnia    Lymphedema    Osteomyelitis of toe of right foot ([place])    Wears dentures    PARTIAL TOP, FULL lower    Past Surgical History:  Procedure Laterality Date   ABDOMINAL HYSTERECTOMY     ARTHRODESIS METATARSALPHALANGEAL JOINT (MTPJ) Right 01/16/2017   Procedure: ARTHRODESIS METATARSALPHALANGEAL JOINT (MTPJ);  Surgeon: Wanda Cooper, DPM;  Location: [place];  Service: Podiatry;  Laterality: Right;  LOCAL/W LMA   BREAST BIOPSY Right 05/09/2014   bx/clip-neg   CARDIAC CATHETERIZATION  2015   1 STENT/ DR Concha Pyo, CARDIOLOGIST AT [facility]   CATARACT EXTRACTION W/PHACO Right 12/31/2021   Procedure: CATARACT EXTRACTION PHACO AND INTRAOCULAR LENS PLACEMENT (IOC) RIGHT;  Surgeon: Wanda Bear, MD;  Location: [place];  Service: Ophthalmology;  Laterality: Right;  3.08 00:23.6   CERVICAL FUSION  2019   CHOLECYSTECTOMY     HAMMER TOE SURGERY Right 01/16/2017   Procedure: HAMMER TOE CORRECTION 4th and 5th toes and overlapping hammer toe correction 2nd toe;  Surgeon: Wanda Cooper, DPM;  Location: [place];  Service: Podiatry;  Laterality: Right;   KNEE ARTHROPLASTY Right    METATARSAL OSTEOTOMY Right 03/14/2016   Procedure: METATARSAL OSTEOTOMY RT 3RD TOE INTERPHALANGEAL JOINT ;  Surgeon: Wanda Cooper, DPM;  Location: [place];  Service: Podiatry;  Laterality: Right;  IVA  WITH LOCAL   THYROIDECTOMY     TONSILLECTOMY      Prior to Admission medications   Medication Sig Start Date End Date Taking? Authorizing Provider  ALPRAZolam Duanne Moron) 0.25 MG tablet Take 0.25 mg by mouth at bedtime as needed for sleep.   Yes [provider]  aspirin 81 MG tablet Take 81 mg by mouth daily. AM   Yes [provider]  atorvastatin (LIPITOR) 40 MG tablet Take 40 mg by mouth daily. PM   Yes [provider]  bisacodyl (DULCOLAX) 5 MG EC tablet Take 5 mg by mouth daily as needed for moderate constipation.   Yes [provider]  docusate sodium (COLACE) 100 MG capsule Take 100 mg by mouth 2 (two) times daily.   Yes [provider]  Ferrous Sulfate (IRON PO) Take 200 mg by mouth.   Yes [provider]  levothyroxine (SYNTHROID, LEVOTHROID) 125 MCG tablet Take 125 mcg by mouth daily before breakfast.   Yes [provider]  metoprolol succinate (TOPROL-XL) 50 MG 24 hr tablet Take 50 mg by mouth 2 (two) times daily. Take with or immediately following a meal.   Yes [provider]  omeprazole (PRILOSEC) 20 MG capsule Take 20 mg by mouth daily.   Yes [provider]  amLODipine (NORVASC) 2.5 MG tablet Take 2.5 mg by mouth daily. AM Patient not taking: Reported on 12/12/2021    [provider]  clopidogrel (PLAVIX) 75 MG tablet Take 75 mg by mouth daily. AM Patient not taking: Reported on 12/12/2021    [provider]  doxycycline (VIBRA-TABS) 100 MG tablet Take 100 mg by mouth 2 (two) times daily.    [provider]  nitroGLYCERIN (NITROSTAT) 0.4 MG SL tablet Place 0.4 mg under the tongue every 5 (five) minutes as needed for chest pain.    [provider]  nystatin cream (MYCOSTATIN) Apply 1 application topically 2 (two) times daily.    [provider]  oxyCODONE-acetaminophen (PERCOCET) 7.5-325 MG tablet Take 1 tablet by mouth every 4 (four) hours as needed for severe  pain. Patient not taking: Reported on 12/31/2021 01/16/17   Wanda Cooper, DPM  ranitidine (ZANTAC) 300 MG tablet Take 300 mg by mouth 2 (two) times daily. Patient not taking: Reported on 12/12/2021    [provider]    Allergies as of 10/04/2021 - Review Complete 11/15/2018  Allergen Reaction Noted   Amoxicillin Nausea Only 03/09/2016   Augmentin [amoxicillin-pot clavulanate] Nausea Only 03/09/2016   Elemental sulfur  03/09/2016   Macrobid [nitrofurantoin monohyd macro] Nausea Only 03/09/2016   Penicillins Nausea And Vomiting 03/09/2016   Septra [sulfamethoxazole-trimethoprim] Other (See Comments) 03/12/2016    Family History  Problem Relation Age of Onset   Breast cancer Maternal Grandmother    Diabetes Maternal Grandmother    COPD Mother    Ulcers Mother    Hypertension Father    Diabetes type II Father    CAD Father    Stroke Father    Cancer - Colon Maternal Aunt    Breast cancer Maternal Aunt        mat great aunt   Diabetes Paternal Grandmother     Social History   Socioeconomic History   Marital status: Married    Spouse name: Not on file   Number of children: Not on file   Years of education: Not on file   Highest education level: Not on file  Occupational History   Not on file  Tobacco Use   Smoking status: Former    Packs/day: 1.00    Years: 32.00    Pack years: 32.00    Types: Cigarettes    Quit date: 08/07/1992    Years since quitting: 29.4   Smokeless tobacco: Never  Substance and Sexual Activity   Alcohol use: No    Comment: [employer]   Drug use: No   Sexual activity: Not on file  Other Topics Concern   Not on file  Social History Narrative   Not on file   Social Determinants of Health   Financial Resource Strain: Not on file  Food Insecurity: Not on file  Transportation Needs: Not on file  Physical Activity: Not on file  Stress: Not on file  Social Connections: Not on file  Intimate Partner Violence: Not on file     Review of Systems: See HPI, otherwise negative ROS  Physical Exam: There were no vitals taken for this visit. General:   Alert, cooperative in NAD Head:  Normocephalic and atraumatic. Respiratory:  Normal work of breathing. Cardiovascular:  RRR  Impression/Plan: Wanda Neighbor is here for cataract surgery.  Risks, benefits, limitations, and alternatives regarding cataract surgery have been reviewed with the patient.  Questions have been answered.  All parties agreeable.   Wanda Pillow, MD  01/14/2022, 10:56 AM

## 2022-01-14 NOTE — Op Note (Signed)
OPERATIVE NOTE  HELI DINO [id number] 01/14/2022   PREOPERATIVE DIAGNOSIS:  Nuclear sclerotic cataract left eye.  H25.12   POSTOPERATIVE DIAGNOSIS:    Nuclear sclerotic cataract left eye.     PROCEDURE:  Phacoemusification with posterior chamber intraocular lens placement of the left eye   LENS:   Implant Name Type Inv. Item Serial No. Manufacturer Lot No. LRB No. Used Action  LENS IOL TECNIS EYHANCE 22.5 - [id number] Intraocular Lens LENS IOL TECNIS EYHANCE 22.5 [id number] [employer]  Left 1 Implanted      Procedure(s) with comments: CATARACT EXTRACTION PHACO AND INTRAOCULAR LENS PLACEMENT (IOC) LEFT (Left) - 4.21 00:33.7  DIB00 +22.5   ULTRASOUND TIME: 0 minutes 33 seconds.  CDE 4.21   SURGEON:  Benay Pillow, MD, MPH   ANESTHESIA:  Topical with tetracaine drops augmented with 1% preservative-free intracameral lidocaine.  ESTIMATED BLOOD LOSS: <1 mL   COMPLICATIONS:  None.   DESCRIPTION OF PROCEDURE:  The patient was identified in the holding room and transported to the operating room and placed in the supine position under the operating microscope.  The left eye was identified as the operative eye and it was prepped and draped in the usual sterile ophthalmic fashion.   A 1.0 millimeter clear-corneal paracentesis was made at the 5:00 position. 0.5 ml of preservative-free 1% lidocaine with epinephrine was injected into the anterior chamber.  The anterior chamber was filled with Healon 5 viscoelastic.  A 2.4 millimeter keratome was used to make a near-clear corneal incision at the 2:00 position.  A curvilinear capsulorrhexis was made with a cystotome and capsulorrhexis forceps.  Balanced salt solution was used to hydrodissect and hydrodelineate the nucleus.   Phacoemulsification was then used in stop and chop fashion to remove the lens nucleus and epinucleus.  The remaining cortex was then removed using the irrigation and aspiration handpiece. Healon was then placed into the  capsular bag to distend it for lens placement.  A lens was then injected into the capsular bag.  The remaining viscoelastic was aspirated.   Wounds were hydrated with balanced salt solution.  The anterior chamber was inflated to a physiologic pressure with balanced salt solution.  Intracameral vigamox 0.1 mL undiltued was injected into the eye and a drop placed onto the ocular surface.  No wound leaks were noted.  The patient was taken to the recovery room in stable condition without complications of anesthesia or surgery  Benay Pillow 01/14/2022, 11:23 AM

## 2022-01-15 ENCOUNTER — Encounter: Payer: Self-pay | Admitting: Ophthalmology

## 2022-10-24 ENCOUNTER — Other Ambulatory Visit: Payer: Self-pay | Admitting: Family Medicine

## 2022-10-24 DIAGNOSIS — Z1231 Encounter for screening mammogram for malignant neoplasm of breast: Secondary | ICD-10-CM

## 2022-11-11 ENCOUNTER — Ambulatory Visit
Admission: RE | Admit: 2022-11-11 | Discharge: 2022-11-11 | Disposition: A | Discharge status: Home / Self Care | Payer: Medicare PPO | Source: Ambulatory Visit | Attending: Family Medicine | Admitting: Family Medicine

## 2022-11-11 DIAGNOSIS — Z1231 Encounter for screening mammogram for malignant neoplasm of breast: Secondary | ICD-10-CM | POA: Diagnosis present

## 2023-04-01 IMAGING — MG MM DIGITAL SCREENING BILAT W/ TOMO AND CAD
6 of 10 series · 6 of 30 positions shown · non-contrast
Comparison: Previous exam(s).

CLINICAL DATA: Screening.

EXAM:
DIGITAL SCREENING BILATERAL MAMMOGRAM WITH TOMOSYNTHESIS AND CAD
TECHNIQUE: Bilateral screening digital craniocaudal and mediolateral oblique
mammograms were obtained. Bilateral screening digital breast
tomosynthesis was performed. The images were evaluated with
computer-aided detection.

[L CC synth-2D]
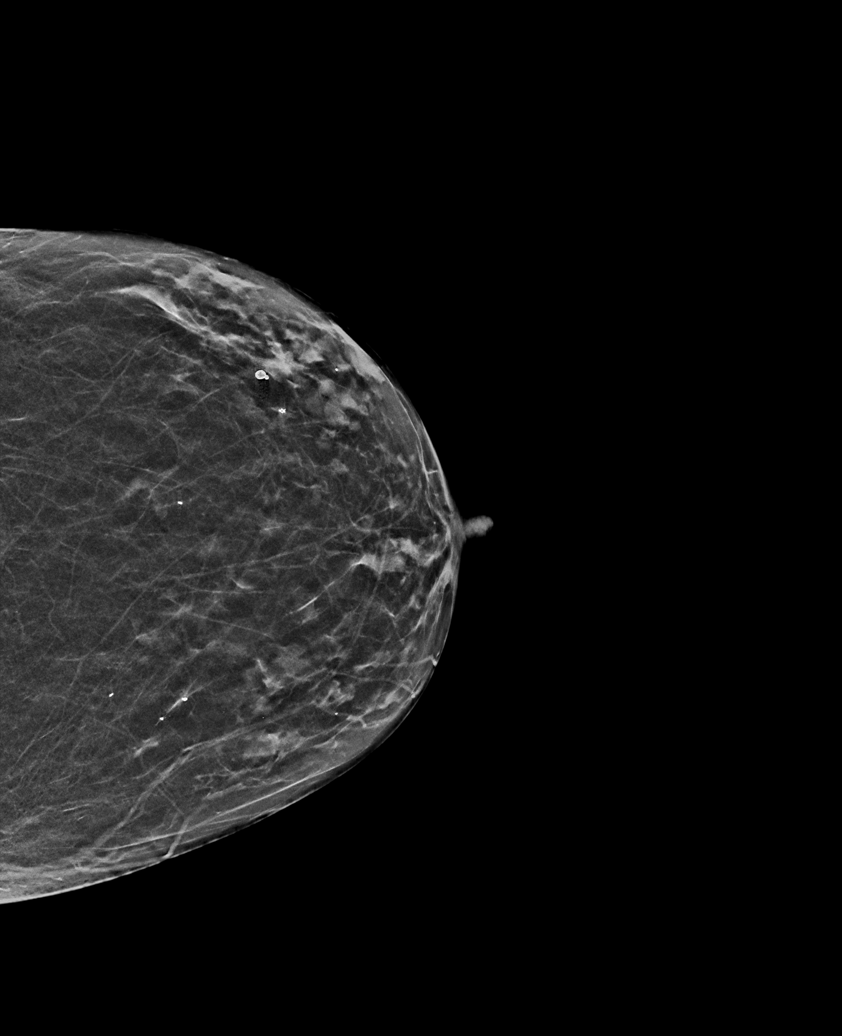

[R MLO synth-2D (1 of 2)]
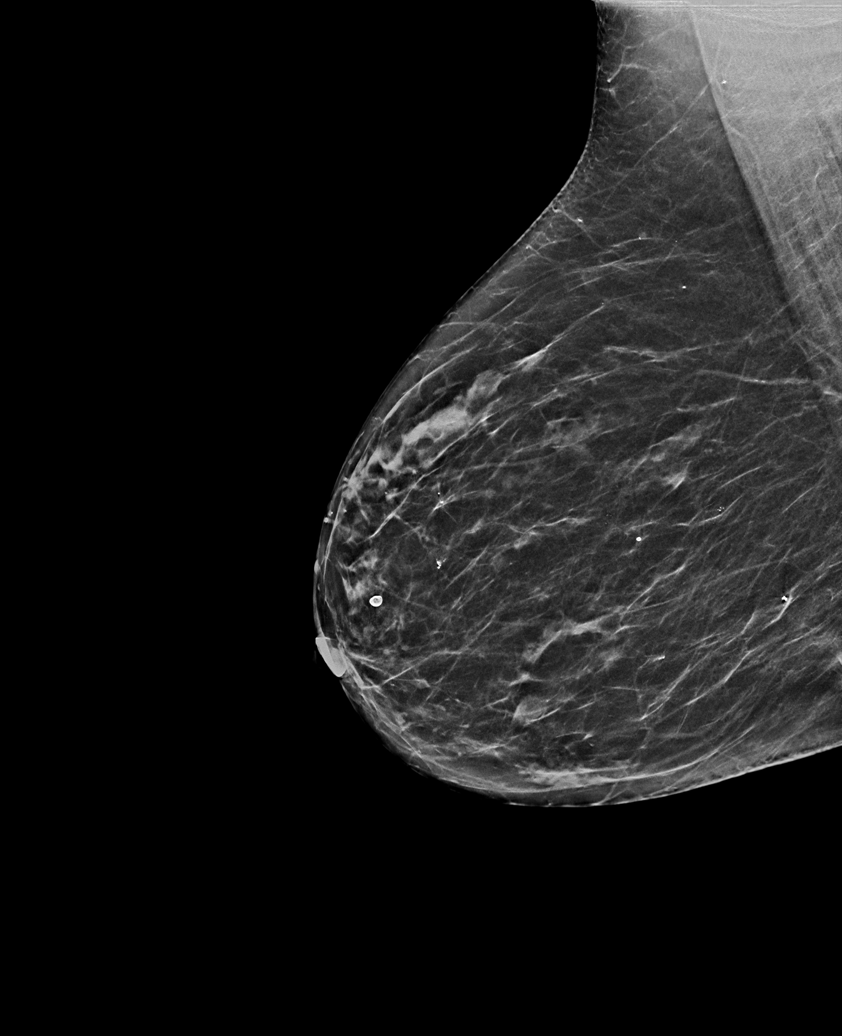

[R CC synth-2D]
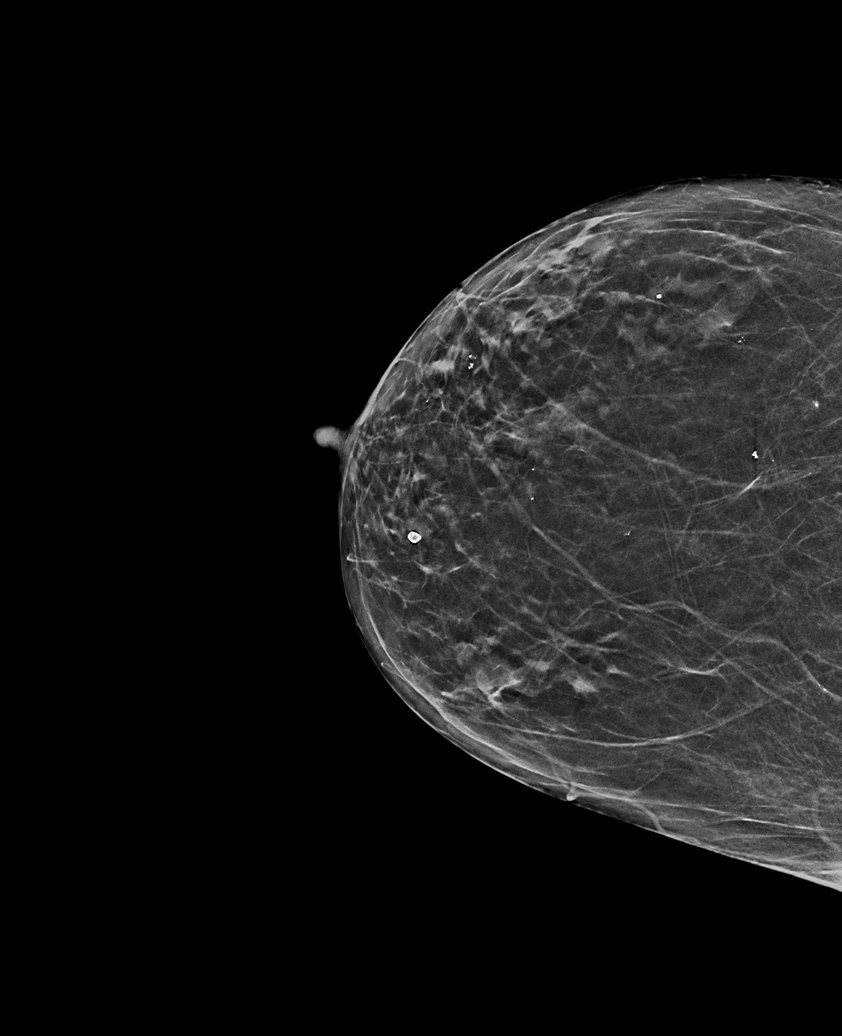

[L MLO synth-2D]
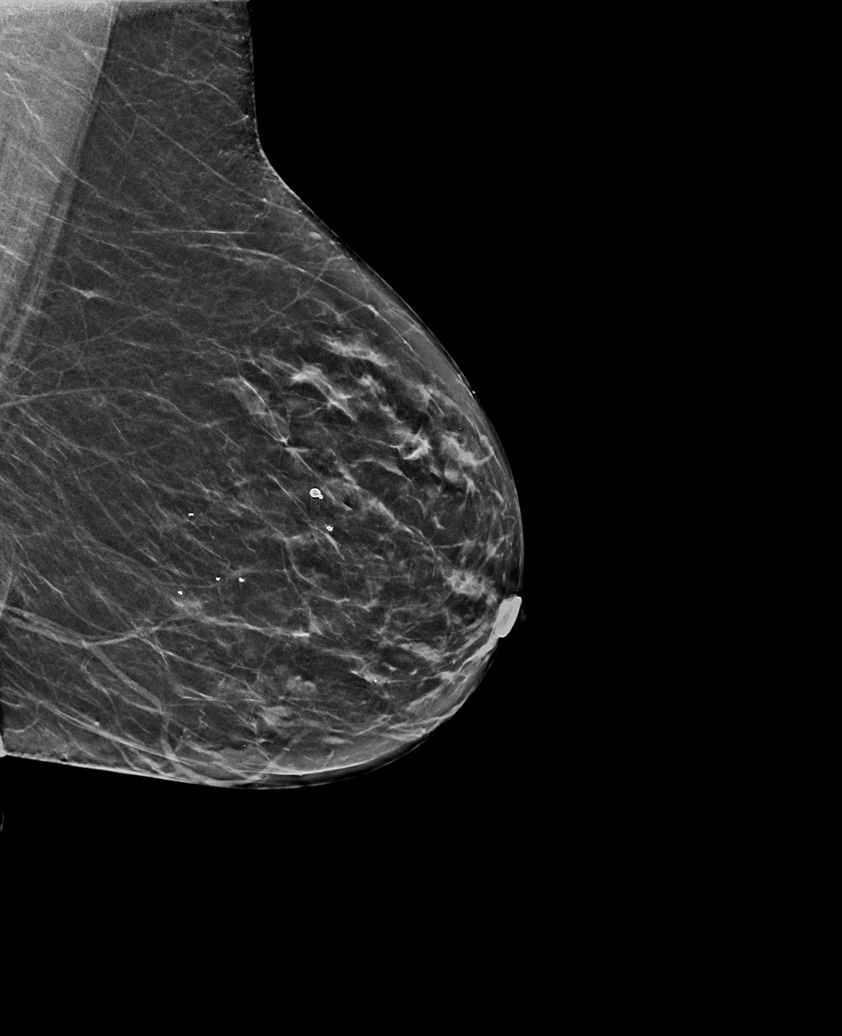

[R MLO synth-2D (2 of 2)]
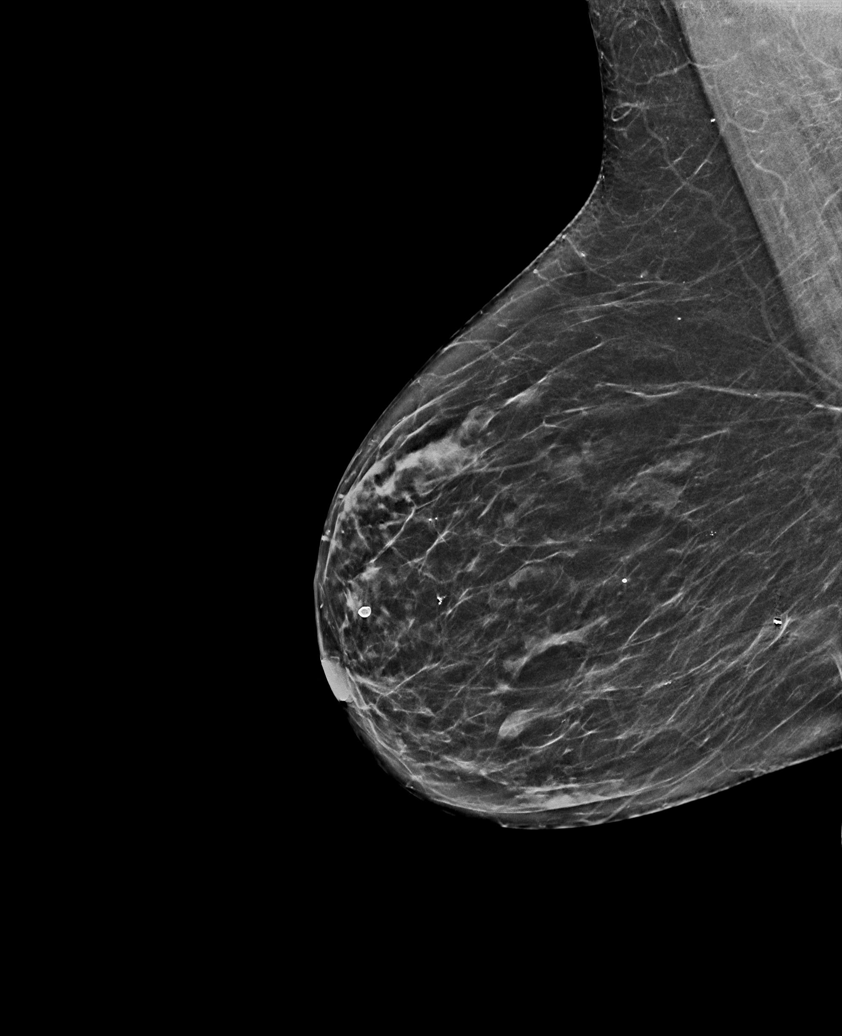

[R CC tomo · tomo slice 25/50.0]
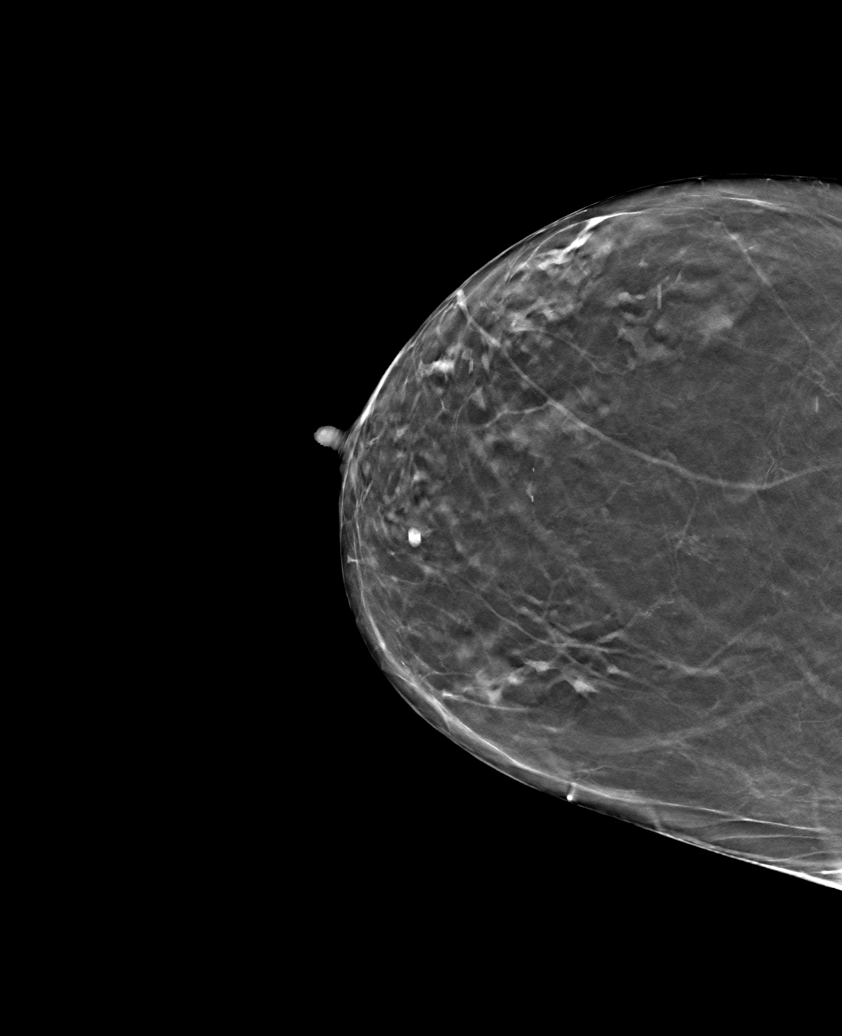

[6 of 30 positions shown; findings below may reference images not displayed]

ACR Breast Density Category b: There are scattered areas of
fibroglandular density.
FINDINGS: There are no findings suspicious for malignancy.
IMPRESSION: No mammographic evidence of malignancy. A result letter of this
screening mammogram will be mailed directly to the patient.

RECOMMENDATION:
Screening mammogram in one year. (Code:51-O-LD2)

BI-RADS CATEGORY  1: Negative.

## 2024-04-05 ENCOUNTER — Other Ambulatory Visit: Payer: Self-pay | Admitting: Family Medicine

## 2024-04-05 DIAGNOSIS — Z780 Asymptomatic menopausal state: Secondary | ICD-10-CM

## 2024-04-05 DIAGNOSIS — Z1231 Encounter for screening mammogram for malignant neoplasm of breast: Secondary | ICD-10-CM

## 2024-06-17 ENCOUNTER — Ambulatory Visit

## 2024-06-17 ENCOUNTER — Other Ambulatory Visit

## 2024-07-07 ENCOUNTER — Ambulatory Visit
Admission: RE | Admit: 2024-07-07 | Discharge: 2024-07-07 | Disposition: A | Discharge status: Home / Self Care | Source: Ambulatory Visit | Attending: Family Medicine | Admitting: Family Medicine

## 2024-07-07 DIAGNOSIS — Z1231 Encounter for screening mammogram for malignant neoplasm of breast: Secondary | ICD-10-CM | POA: Diagnosis present

## 2024-07-07 DIAGNOSIS — Z780 Asymptomatic menopausal state: Secondary | ICD-10-CM | POA: Insufficient documentation
